# Patient Record
Sex: Male | Born: 1975
Health system: Southern US, Community
[De-identification: ages and names within clinical notes are randomized; demographics above are authoritative.]

## PROBLEM LIST (undated history)

## (undated) DIAGNOSIS — K219 Gastro-esophageal reflux disease without esophagitis: Secondary | ICD-10-CM

## (undated) DIAGNOSIS — Z973 Presence of spectacles and contact lenses: Secondary | ICD-10-CM

## (undated) DIAGNOSIS — G8929 Other chronic pain: Secondary | ICD-10-CM

## (undated) DIAGNOSIS — M549 Dorsalgia, unspecified: Secondary | ICD-10-CM

## (undated) DIAGNOSIS — Z720 Tobacco use: Secondary | ICD-10-CM

## (undated) DIAGNOSIS — F419 Anxiety disorder, unspecified: Secondary | ICD-10-CM

## (undated) DIAGNOSIS — G4489 Other headache syndrome: Secondary | ICD-10-CM

## (undated) HISTORY — DX: Dorsalgia, unspecified: M54.9

## (undated) HISTORY — PX: SHOULDER SURGERY: SHX246

## (undated) HISTORY — PX: MANDIBLE FRACTURE SURGERY: SHX706

## (undated) HISTORY — DX: Presence of spectacles and contact lenses: Z97.3

## (undated) HISTORY — DX: Other chronic pain: G89.29

## (undated) HISTORY — DX: Other headache syndrome: G44.89

## (undated) HISTORY — DX: Tobacco use: Z72.0

## (undated) HISTORY — DX: Anxiety disorder, unspecified: F41.9

---

## 2012-09-23 ENCOUNTER — Encounter (HOSPITAL_COMMUNITY): Payer: Self-pay | Admitting: *Deleted

## 2012-09-23 ENCOUNTER — Emergency Department (HOSPITAL_COMMUNITY)
Admission: EM | Admit: 2012-09-23 | Discharge: 2012-09-23 | Disposition: A | Discharge status: Home / Self Care | Payer: BC Managed Care – PPO | Attending: Emergency Medicine | Admitting: Emergency Medicine

## 2012-09-23 DIAGNOSIS — R5381 Other malaise: Secondary | ICD-10-CM | POA: Insufficient documentation

## 2012-09-23 DIAGNOSIS — R5383 Other fatigue: Secondary | ICD-10-CM | POA: Insufficient documentation

## 2012-09-23 DIAGNOSIS — R42 Dizziness and giddiness: Secondary | ICD-10-CM | POA: Insufficient documentation

## 2012-09-23 DIAGNOSIS — R110 Nausea: Secondary | ICD-10-CM | POA: Insufficient documentation

## 2012-09-23 DIAGNOSIS — F17200 Nicotine dependence, unspecified, uncomplicated: Secondary | ICD-10-CM | POA: Insufficient documentation

## 2012-09-23 DIAGNOSIS — R531 Weakness: Secondary | ICD-10-CM

## 2012-09-23 LAB — CBC WITH DIFFERENTIAL/PLATELET
Eosinophils Absolute: 0.1 10*3/uL (ref 0.0–0.7)
Eosinophils Relative: 1 % (ref 0–5)
Hemoglobin: 16.9 g/dL (ref 13.0–17.0)
Lymphs Abs: 1.9 10*3/uL (ref 0.7–4.0)
MCH: 33.1 pg (ref 26.0–34.0)
MCV: 93.7 fL (ref 78.0–100.0)
Monocytes Relative: 7 % (ref 3–12)
Platelets: 257 10*3/uL (ref 150–400)
RBC: 5.11 MIL/uL (ref 4.22–5.81)

## 2012-09-23 LAB — HEPATIC FUNCTION PANEL
ALT: 15 U/L (ref 0–53)
Alkaline Phosphatase: 87 U/L (ref 39–117)
Bilirubin, Direct: 0.2 mg/dL (ref 0.0–0.3)
Indirect Bilirubin: 1 mg/dL — ABNORMAL HIGH (ref 0.3–0.9)
Total Bilirubin: 1.2 mg/dL (ref 0.3–1.2)

## 2012-09-23 LAB — BASIC METABOLIC PANEL
CO2: 28 mEq/L (ref 19–32)
Calcium: 9.7 mg/dL (ref 8.4–10.5)
Glucose, Bld: 96 mg/dL (ref 70–99)
Sodium: 138 mEq/L (ref 135–145)

## 2012-09-23 NOTE — ED Notes (Signed)
Feels "light headed for 2-3 days".  Nausea, no vomiting , no diarrhea.  Had similar episode in November and attributed to "energy drinks".  Alert, talking,No Hx of HI, No tarry stools.

## 2012-09-23 NOTE — ED Provider Notes (Signed)
History    This chart was scribed for Benny Lennert, MD by Leone Payor, ED Scribe. This patient was seen in room APA02/APA02 and the patient's care was started 3:11 PM.   CSN: [id number]  Arrival date & time 09/23/12  1300   None     No chief complaint on file.    The history is provided by the patient. No language interpreter was used.    Vincent Walter is a 37 y.o. male who presents to the Emergency Department complaining of intermittent episodes of lightheadedness for 2-3 days ago. States when he has an episode he gets a hot flash and then is nauseated for about 1 hour afterwards. Reports having a similar episode in November that was attributed to an energy drinks. He denies fever, chills, vomiting, diarrhea. Pt is a current everyday smoker and occasional alcohol user.    History reviewed. No pertinent past medical history.  History reviewed. No pertinent past surgical history.  History reviewed. No pertinent family history.  History  Substance Use Topics  . Smoking status: Current Every Day Smoker  . Smokeless tobacco: Not on file  . Alcohol Use: Yes      Review of Systems  Constitutional: Negative for fever, chills, appetite change and fatigue.  HENT: Negative for congestion, sinus pressure and ear discharge.   Eyes: Negative for discharge.  Respiratory: Negative for cough.   Cardiovascular: Negative for chest pain.  Gastrointestinal: Positive for nausea. Negative for vomiting, abdominal pain and diarrhea.  Genitourinary: Negative for frequency and hematuria.  Musculoskeletal: Negative for back pain.  Skin: Negative for rash.  Neurological: Positive for light-headedness. Negative for seizures and headaches.  Psychiatric/Behavioral: Negative for hallucinations.    Allergies  Review of patient's allergies indicates no known allergies.  Home Medications  No current outpatient prescriptions on file.  BP 123/83  Pulse 70  Temp(Src) 97.8 F (36.6 C) (Oral)   Resp 20  Ht 5\' 8"  (1.727 m)  Wt 145 lb (65.772 kg)  BMI 22.05 kg/m2  SpO2 100%  Physical Exam  Nursing note and vitals reviewed. Constitutional: He is oriented to person, place, and time. He appears well-developed.  HENT:  Head: Normocephalic.  Eyes: Conjunctivae and EOM are normal. No scleral icterus.  Neck: Neck supple. No thyromegaly present.  Cardiovascular: Normal rate and regular rhythm.  Exam reveals no gallop and no friction rub.   No murmur heard. Pulmonary/Chest: No stridor. He has no wheezes. He has no rales. He exhibits no tenderness.  Abdominal: He exhibits no distension. There is no tenderness. There is no rebound.  Musculoskeletal: Normal range of motion. He exhibits no edema.  Lymphadenopathy:    He has no cervical adenopathy.  Neurological: He is oriented to person, place, and time. Coordination normal.  Skin: No rash noted. No erythema.  Psychiatric: He has a normal mood and affect. His behavior is normal.    ED Course  Procedures (including critical care time) DIAGNOSTIC STUDIES: Oxygen Saturation is 100% on room air, normal by my interpretation.    COORDINATION OF CARE: 3:12 PM Discussed treatment plan with pt at bedside and pt agreed to plan.   Results for orders placed during the hospital encounter of 09/23/12  BASIC METABOLIC PANEL      Result Value Range   Sodium 138  135 - 145 mEq/L   Potassium 4.1  3.5 - 5.1 mEq/L   Chloride 103  96 - 112 mEq/L   CO2 28  19 - 32 mEq/L  Glucose, Bld 96  70 - 99 mg/dL   BUN 9  6 - 23 mg/dL   Creatinine, Ser 4.09  0.50 - 1.35 mg/dL   Calcium 9.7  8.4 - 81.1 mg/dL   GFR calc non Af Amer >90  >90 mL/min   GFR calc Af Amer >90  >90 mL/min  CBC WITH DIFFERENTIAL      Result Value Range   WBC 6.8  4.0 - 10.5 K/uL   RBC 5.11  4.22 - 5.81 MIL/uL   Hemoglobin 16.9  13.0 - 17.0 g/dL   HCT 91.4  78.2 - 95.6 %   MCV 93.7  78.0 - 100.0 fL   MCH 33.1  26.0 - 34.0 pg   MCHC 35.3  30.0 - 36.0 g/dL   RDW 21.3  08.6 -  57.8 %   Platelets 257  150 - 400 K/uL   Neutrophils Relative 64  43 - 77 %   Neutro Abs 4.3  1.7 - 7.7 K/uL   Lymphocytes Relative 28  12 - 46 %   Lymphs Abs 1.9  0.7 - 4.0 K/uL   Monocytes Relative 7  3 - 12 %   Monocytes Absolute 0.5  0.1 - 1.0 K/uL   Eosinophils Relative 1  0 - 5 %   Eosinophils Absolute 0.1  0.0 - 0.7 K/uL   Basophils Relative 1  0 - 1 %   Basophils Absolute 0.0  0.0 - 0.1 K/uL  TROPONIN I      Result Value Range   Troponin I <0.30  <0.30 ng/mL  HEPATIC FUNCTION PANEL      Result Value Range   Total Protein 7.2  6.0 - 8.3 g/dL   Albumin 4.2  3.5 - 5.2 g/dL   AST 16  0 - 37 U/L   ALT 15  0 - 53 U/L   Alkaline Phosphatase 87  39 - 117 U/L   Total Bilirubin 1.2  0.3 - 1.2 mg/dL   Bilirubin, Direct 0.2  0.0 - 0.3 mg/dL   Indirect Bilirubin 1.0 (*) 0.3 - 0.9 mg/dL    No results found.   No diagnosis found.    MDM      The chart was scribed for me under my direct supervision.  I personally performed the history, physical, and medical decision making and all procedures in the evaluation of this patient.Benny Lennert, MD 09/23/12 [place] [place]

## 2012-09-23 NOTE — ED Notes (Signed)
Patient notes lightheadedness, slight vertigo w/position changes from sitting to standing on occasion. This is usually accompanied by "an out of body kind of feeling" and nausea.  Recent past had what he thought might be sinus infection.  Tympanic membranes transluscent w/+ light reflex.  No c/o HAs, changes in appetite or pain.

## 2012-09-25 ENCOUNTER — Encounter: Payer: Self-pay | Admitting: Medical

## 2012-09-25 ENCOUNTER — Ambulatory Visit (INDEPENDENT_AMBULATORY_CARE_PROVIDER_SITE_OTHER): Payer: BC Managed Care – PPO | Admitting: Medical

## 2012-09-25 VITALS — BP 112/70 | HR 70 | Temp 98.3°F | Resp 16 | Ht 68.0 in | Wt 138.0 lb

## 2012-09-25 DIAGNOSIS — R42 Dizziness and giddiness: Secondary | ICD-10-CM

## 2012-09-25 DIAGNOSIS — R630 Anorexia: Secondary | ICD-10-CM

## 2012-09-25 DIAGNOSIS — R5381 Other malaise: Secondary | ICD-10-CM

## 2012-09-25 DIAGNOSIS — F430 Acute stress reaction: Secondary | ICD-10-CM

## 2012-09-25 DIAGNOSIS — R002 Palpitations: Secondary | ICD-10-CM

## 2012-09-25 LAB — POCT URINALYSIS DIPSTICK
Ketones, UA: NEGATIVE
Protein, UA: NEGATIVE
Spec Grav, UA: <=1.005
pH, UA: 5

## 2012-09-25 MED ORDER — ALPRAZOLAM 0.25 MG PO TABS: 0.2500 mg | ORAL_TABLET | Freq: Two times a day (BID) | ORAL | Status: DC | PRN

## 2012-09-25 NOTE — Progress Notes (Signed)
Subjective:  Vincent Walter is a 37 y.o. male who presents as a new patient.  Was seen recently in the ED for lightheadedness.  Here with fiance.  Lives at home with fiance and 7yo daughter, works in Paramedic, Teacher, English as a foreign language, does work for [employer].  Smokes 1ppd.  Has 2 beers during the week but 12 pack on weekends.   He reports that his 17yo daughter has hx/o panic attacks.    He was seen in the ED recently for lightheadedness.  They felt like this could be anxiety. Had labs.  Recently he walked into a store, got hot flushed feeling, lightheaded, felt like he was going to pass out.  When he got to the register, a guy started talking to him, and he was feeling better.  But then when he got back in the care ,felt the same way again.  The last 3 days has felt lightheadded, exhausted .  Fiance took him to ED this past week for this symptoms.  This week he has felt tired.  He notes hx/o similar event 11/13, was taken to ED in [place] for the same.  He is stressed.  Their baby sitter left, had to transition one daughter into a new school, husband anxious about getting married, getting fiance a ring, sometimes worries about dying.     He sometimes skips meals.  Tends to drink energy drinks 2-3 times per week at least.  Drinks at least 3 sodas daily.  No other aggravating or relieving factors.    No other c/o.  The following portions of the patient's history were reviewed and updated as appropriate: allergies, current medications, past family history, past medical history, past social history, past surgical history and problem list.  Review of Systems Constitutional: -fever, -chills, -sweats, -unexpected weight change, +fatigue, +decreased appetite ENT: -runny nose, -ear pain, -sore throat Cardiology:  -chest pain, +palpitations, -edema Respiratory: -cough, -shortness of breath, -wheezing Gastroenterology: -abdominal pain, +nausea, -vomiting, -diarrhea, -constipation  Hematology: -bleeding or  bruising problems Musculoskeletal: -arthralgias, -myalgias, -joint swelling, -back pain Ophthalmology: -vision changes Urology: -dysuria, -difficulty urinating, -hematuria, -urinary frequency, -urgency Neurology: -headache, -weakness, -tingling, -numbness   Past Medical History  Diagnosis Date  . Wears glasses     Objective: Physical Exam  Vital signs reviewed  General appearance: alert, no distress, WD/WN, lean white male HEENT: normocephalic, sclerae anicteric, conjunctiva pink and moist, TMs pearly, nares patent, no discharge or erythema, pharynx normal Oral cavity: MMM, no lesions Neck: supple, no lymphadenopathy, no thyromegaly, no masses Heart: RRR, normal S1, S2, no murmurs Lungs: CTA bilaterally, no wheezes, rhonchi, or rales Abdomen: +bs, soft, non tender, non distended, no masses, no hepatomegaly, no splenomegaly Pulses: 2+ radial pulses, 2+ pedal pulses, normal cap refill Ext: no edema Neuro: CN2-12 intact, nonfocal exam Psych: pleasant, good eye contact, answers questions appropriately   Adult ECG Report  Indication: palpitations  Rate: 55 bpm  Rhythm: sinus bradycardia  QRS Axis: 78 degrees  PR Interval:  QRS Duration: 96ms  QTc:  Conduction Disturbances: none  Other Abnormalities: none  Patient's cardiac risk factors are: male gender.  EKG comparison: none  Narrative Interpretation: sinus bradycardia   Assessment: Encounter Diagnoses  Name Primary?  . Lightheadedness Yes  . Palpitations   . Other malaise and fatigue   . Decreased appetite     Plan: Reviewed EKG, symptoms, concerns.  Reviewed recent ED report, labs from ED.   Will check thyroid labs today.  Discussed his recent symptoms, short term script  for Xanax for possible panic attack, discussed risks/benefits of medication, discussed stress reduction, significantly decreasing caffeine and alcohol intake, increasing water intake, eating healthy.  Advised if he continues to have  palpitation, pre-syncope or similar despite the changes discussed, may need to have Holter testing, but wait and see approach.  Follow up:  2-3 wk.

## 2012-09-25 NOTE — Patient Instructions (Signed)
Work on stress reduction  Try and cut back gradually on caffeine.   STOP energy drinks  Drink more water in general  Cut back on the weekend alcohol   Avoid going more than 4 hours without some food source.     Anxiety and Panic Attacks Your caregiver has informed you that you are having an anxiety or panic attack. There may be many forms of this. Most of the time these attacks come suddenly and without warning. They come at any time of day, including periods of sleep, and at any time of life. They may be strong and unexplained. Although panic attacks are very scary, they are physically harmless. Sometimes the cause of your anxiety is not known. Anxiety is a protective mechanism of the body in its fight or flight mechanism. Most of these perceived danger situations are actually nonphysical situations (such as anxiety over losing a job). CAUSES  The causes of an anxiety or panic attack are many. Panic attacks may occur in otherwise healthy people given a certain set of circumstances. There may be a genetic cause for panic attacks. Some medications may also have anxiety as a side effect. SYMPTOMS  Some of the most common feelings are:  Intense terror.   Dizziness, feeling faint.   Hot and cold flashes.   Fear of going crazy.   Feelings that nothing is real.   Sweating.   Shaking.   Chest pain or a fast heartbeat (palpitations).   Smothering, choking sensations.   Feelings of impending doom and that death is near.   Tingling of extremities, this may be from over-breathing.   Altered reality (derealization).   Being detached from yourself (depersonalization).  Several symptoms can be present to make up anxiety or panic attacks. DIAGNOSIS  The evaluation by your caregiver will depend on the type of symptoms you are experiencing. The diagnosis of anxiety or panic attack is made when no physical illness can be determined to be a cause of the symptoms. TREATMENT  Treatment to  prevent anxiety and panic attacks may include:  Avoidance of circumstances that cause anxiety.   Reassurance and relaxation.   Regular exercise.   Relaxation therapies, such as yoga.   Psychotherapy with a psychiatrist or therapist.   Avoidance of caffeine, alcohol and illegal drugs.   Prescribed medication.  SEEK IMMEDIATE MEDICAL CARE IF:   You experience panic attack symptoms that are different than your usual symptoms.   You have any worsening or concerning symptoms.  Document Released: 05/08/2005 Document Revised: 01/18/2011 Document Reviewed: 09/09/2009 [facility] Patient Information 2012 [place], [place].

## 2012-09-26 ENCOUNTER — Telehealth: Payer: Self-pay | Admitting: Internal Medicine

## 2012-09-26 NOTE — Telephone Encounter (Signed)
Pt called wanting to know if you wanted him to go on light duty and no lifting heavy things due to his light headiness and his low heart rate.

## 2012-09-27 NOTE — Telephone Encounter (Signed)
What kind of work does he do - get some specifics

## 2012-09-27 NOTE — Telephone Encounter (Signed)
Patient states to not worry working light duty because they don't have that they will just send him home. CLS

## 2012-10-22 ENCOUNTER — Encounter: Payer: Self-pay | Admitting: Medical

## 2012-10-22 ENCOUNTER — Ambulatory Visit (INDEPENDENT_AMBULATORY_CARE_PROVIDER_SITE_OTHER): Payer: BC Managed Care – PPO | Admitting: Medical

## 2012-10-22 VITALS — BP 120/80 | HR 76 | Temp 98.1°F | Resp 18 | Wt 139.0 lb

## 2012-10-22 DIAGNOSIS — F411 Generalized anxiety disorder: Secondary | ICD-10-CM

## 2012-10-22 MED ORDER — ALPRAZOLAM 0.25 MG PO TABS: 0.2500 mg | ORAL_TABLET | Freq: Two times a day (BID) | ORAL | Status: DC | PRN

## 2012-10-22 NOTE — Patient Instructions (Signed)
RESOURCES in [place], [place]  If you are experiencing a mental health crisis or an emergency, please call 911 or go to the nearest emergency department.  [facility]   [phone] [facility]  [phone] [facility]   [phone]  Suicide Hotline 1-800-Suicide (1-800-784-2433)  National Suicide Prevention Lifeline 1-800-273-TALK  (1-800-273-8255)  Domestic Violence, Rape/Crisis - [facility] 336-273-7273  The National Domestic Violence Hotline 1-800-799-SAFE (1-800-799-7233)  To report Child or Elder Abuse, please call: [place] Police Department  [phone] [place] Sherriff Department  [phone]  LGBT Youth Crisis Line 1-866-488-7386  Teen Crisis line 336-387-6161 or 1-877-332-7333     Psychiatry and Counseling services   Dr. Parish McKinney, psychiatry [phone] office [email or web address] [address], [address], [place], [place] [address] Dr. Parish McKinney Meredith Baker, NP Micheal Lefaive, NP  Anxiety, Depression, ADHD, OCD, Eating Disorders, Bipolar, other   [facility] [phone] office [email or web address] [address] [place], [place] [address]  Dr. Masoud Hejazi, psychiatry services  Dr. Todd Lewis  Depression, Anxiety, Substance Abuse, Couples Issues, Adolescent Issues Robyn Bridges, NP Depression, Anxiety, ADHD, Women's issues, Bipolar Disorder, Substance   Abuse Kelly Virgil, NP Depression, Anxiety, Aging, ADHD, Bipolar Disorder, Substance Abuse Ann Bailey, NP  Mood disturbances, ADHD, children, adolescents, adults Deb Connery, Therapist Sexual Addiction, Bipolar, Depression, Anxiety, Substance Addiction Claudia McCoy, Therapist Grief and Loss, Anxiety, Depression, Bipolar, Medical Challenges, Life    Transitions Linda Hileman, Therapist Substance Abuse, Relationships, Clergy Families, Anger and Stress Management, Postpartum Depression,  Pre-Marital Counseling Patricia Goral, Therapist [place], Anxiety, Depression, ADHD, Adjustment Disorder, PTSD, Grief and Loss, Divorce, Adoption Concerns   [facility] [phone] office [email or web address] [address], [place], [place] [address]  Erik Nelson, MA, clinical psychologist  Cognitive-Behavior Therapy; Mood Disorders; Anxiety Disorders; adult and child ADHD; Family Therapy; Stress Management; personal growth, and Marital Therapy.    Dennis McKnight Ph.D., clinical psychologist Cognitive-Behavior Therapy; Mood Disorders; Anxiety Disorders; Stress     Management   Elaine Talbert Ph.D., clinical psychologist [phone] office [address] [place], [place] [address] Cognitive Behavior Therapy, Depression, Bipolar, Anxiety, Grief and Loss    [facility] [phone] office [address] [place], [place] [address] Crisis services, Family support, in home therapy, treatment for Anxiety, PTSD, Sexual Assault, Substance Abuse, Financial/Credit Counseling, Variety of other services    [email or web address] [phone] office [address], [place], [place] [address]  Scott Yount, Ph.D., LPC Family, Couples, Anxiety, Depression, ADHD, Abuse, Anger Management Catharine Dowda, M.Ed., LPC Couples, Sexual orientation, Domestic violence, Child Abuse, Major Life Change,  Depression Traci Collins, LPC Marriage counseling, Women's Issues, Depression, Intimacy, Career Issues Katherine Glenn, Ph.D.,  LPC PTSD, Addictions, Grief, Anxiety, Sexual Orientation Eugene Naughton, LPC Teen and child depression, anxiety, parenting challenges, Adult depression, self injury, relationship issues. Karen Elliott, LPC Addiction, PTSD, Eating Disorders, Depression, Sexual Orientation Sara DeHart Young, LPC Eating  disorder, Anxiety and Depression, Grief, Divorce, Couples and Family Counseling, Parenting   Dr. Gerald Plovsky, psychiatry [phone] office [address] [place], [place] [address]  Geriatric psychiatry services   [facility] [phone] office, 24x7 help line [email or web address] [address] [place], [place] [address] Substance Abuse, Depression, Anxiety, Mood Disorders, other Addictions, DWI Assessment/Treatment, Teen Issues, ADHD, Family Therapy Dr. Carol Sena, Psychiatry services   Stephen Ringer, Therapist Initial assessments, Clinical Director, Substance Abuse counseling, DWI and DMV assessments, individual and group counseling   Nancy King, Therapist Depression, Anxiety, Dysfunctional families, Individual and Couples Counseling, Addiction, Sexual Abuse, Childhood Trauma, Spiritually Based Counseling Robin Ringer, Therapist [facility], Children and Adult Individual Counseling, Depression, Anxiety, Mood Disorders Valerie Phillips, Therapist Ages 5 and up, individual, couple and family therapy, family concerns, ADHD, Mood disorders, Grief, Substance Abuse Melissa Mekita, Therapist Male patients only - Mood disorders, Depression, Anxiety, PTSD, Gried,   Abuse, Relationships   Dr. Rupinder Kaur, psychiatry [phone] office [address], [place], [place] [address]   

## 2012-10-22 NOTE — Progress Notes (Signed)
Subjective: Here for recheck.   Girlfriend accompanies him.  Last visit he was a new patient with several concerns - palpitations, fatigue, panicky feeling.   We discussed last visit the likelihood that his symptoms represent anxiety.  Since last visit he has cut out beer mostly, still smoking though, has significantly cut back on caffeine.  Busy at work.   Has used the xanax on several occasions, and it seems to help.  The medication helps calm him down.  At times feels overwhelmed, seems like he has a lot on his plate at work and at home.  He thinks most of his issues are due to work stress, relationship with his boss.   Some days is fine, others is like boss has an issue with him.   He also has stress with home issues - they recently moved, they are contemplating buying another car due to family needs, still trying to finance a ring for fiance/girlfriend of 9 years.  Overall he does feel like he recognizes now that the symptoms have been related to his stress level and anxiety  Past Medical History  Diagnosis Date  . Wears glasses    ROS as in subjective  Objective: Gen: wd, wn, nad Psych: seems more relaxed today, good eye contact, answers questions appropriately  Assessment: Encounter Diagnosis  Name Primary?  Marland Kitchen Anxiety state, unspecified Yes     Plan: Since last visit, no additional palpitations, no lightheadedness.  He is more aware of acute panic symptoms, when he is stressed, and when he is feeling more comfortable.  He has cut back on alcohol and caffeine.   He is using the Xanax appropriately.   I advised he seek both general and financial counseling, establish a budget for his household, work on stress reduction, work on [employer], and cutting out any excess spending that may be causing undue stress.   He has employee assistance program through work that offers counseling.  Advised he pursue this.  F/u 1-2 mo

## 2013-02-06 ENCOUNTER — Encounter (HOSPITAL_COMMUNITY): Payer: Self-pay | Admitting: Emergency Medicine

## 2013-02-06 ENCOUNTER — Emergency Department (HOSPITAL_COMMUNITY)
Admission: EM | Admit: 2013-02-06 | Discharge: 2013-02-06 | Disposition: A | Discharge status: Home / Self Care | Payer: BC Managed Care – PPO | Attending: Emergency Medicine | Admitting: Emergency Medicine

## 2013-02-06 DIAGNOSIS — F17200 Nicotine dependence, unspecified, uncomplicated: Secondary | ICD-10-CM | POA: Insufficient documentation

## 2013-02-06 DIAGNOSIS — Y939 Activity, unspecified: Secondary | ICD-10-CM | POA: Insufficient documentation

## 2013-02-06 DIAGNOSIS — IMO0002 Reserved for concepts with insufficient information to code with codable children: Secondary | ICD-10-CM | POA: Insufficient documentation

## 2013-02-06 DIAGNOSIS — Y929 Unspecified place or not applicable: Secondary | ICD-10-CM | POA: Insufficient documentation

## 2013-02-06 DIAGNOSIS — T162XXA Foreign body in left ear, initial encounter: Secondary | ICD-10-CM

## 2013-02-06 DIAGNOSIS — T169XXA Foreign body in ear, unspecified ear, initial encounter: Secondary | ICD-10-CM | POA: Insufficient documentation

## 2013-02-06 MED ORDER — ANTIPYRINE-BENZOCAINE 5.4-1.4 % OT SOLN
OTIC | Status: AC
  Filled 2013-02-06: qty 10

## 2013-02-06 NOTE — ED Provider Notes (Signed)
CSN: [id number]     Arrival date & time 02/06/13  1835 History   First MD Initiated Contact with Patient 02/06/13 1850     Chief Complaint  Patient presents with  . Foreign Body in Ear   (Consider location/radiation/quality/duration/timing/severity/associated sxs/prior Treatment) HPI Comments: Jenner Rosier is a 37 y.o. Male who was walking out of his childs school just prior to arrival when a bug flew in his left ear.  He describes a fluttering sensation but denies pain in the ear.  He is otherwise without complaint.  He denies decreased hearing acuity.     The history is provided by the patient.    Past Medical History  Diagnosis Date  . Wears glasses    History reviewed. No pertinent past surgical history. Family History  Problem Relation Age of Onset  . Alcohol abuse Mother   . Hypertension Father   . Hyperlipidemia Brother   . Anxiety disorder Daughter   . Heart disease Neg Hx   . Other Neg Hx    History  Substance Use Topics  . Smoking status: Current Every Day Smoker -- 1.00 packs/day    Types: Cigarettes  . Smokeless tobacco: Never Used  . Alcohol Use: 4.2 oz/week    7 Cans of beer per week    Review of Systems  HENT: Negative for hearing loss, ear pain and ear discharge.   Neurological: Negative for dizziness and headaches.  All other systems reviewed and are negative.    Allergies  Review of patient's allergies indicates no known allergies.  Home Medications   Current Outpatient Rx  Name  Route  Sig  Dispense  Refill  . ALPRAZolam (XANAX) 0.25 MG tablet   Oral   Take 1 tablet (0.25 mg total) by mouth 2 (two) times daily as needed for sleep.   30 tablet   1    BP 148/71  Pulse 65  Temp(Src) 98.4 F (36.9 C)  Resp 16  Ht 5\' 8"  (1.727 m)  Wt 145 lb (65.772 kg)  BMI 22.05 kg/m2  SpO2 100% Physical Exam  Constitutional: He is oriented to person, place, and time. He appears well-developed and well-nourished.  HENT:  Head: Normocephalic and  atraumatic.  Left Ear: Tympanic membrane normal. A foreign body is present.  Nose: Nose normal.  Mouth/Throat: Uvula is midline, oropharynx is clear and moist and mucous membranes are normal. No oropharyngeal exudate, posterior oropharyngeal edema, posterior oropharyngeal erythema or tonsillar abscesses.  Small worm like object along anterior canal.  Eyes: Conjunctivae are normal.  Cardiovascular: Normal rate and normal heart sounds.   Pulmonary/Chest: Effort normal. No respiratory distress. He has no wheezes. He has no rales.  Abdominal: Soft. There is no tenderness.  Musculoskeletal: Normal range of motion.  Neurological: He is alert and oriented to person, place, and time.  Skin: Skin is warm and dry. No rash noted.  Psychiatric: He has a normal mood and affect.    ED Course  FOREIGN BODY REMOVAL Date/Time: 02/06/2013 7:16 PM Performed by: Burgess Amor Authorized by: Burgess Amor Consent: Verbal consent obtained. Risks and benefits: risks, benefits and alternatives were discussed Consent given by: patient Body area: ear Location details: left ear Localization method: visualized Removal mechanism: forceps Complexity: simple 1 objects recovered. Objects recovered: worm Post-procedure assessment: foreign body removed Patient tolerance: Patient tolerated the procedure well with no immediate complications.   (including critical care time) Labs Review Labs Reviewed - No data to display Imaging Review No results found.  MDM  1. Ear foreign body, left, initial encounter    Prn f/u anticipated    Burgess Amor, Cordelia Poche 02/06/13 1610

## 2013-02-06 NOTE — ED Provider Notes (Signed)
Medical screening examination/treatment/procedure(s) were performed by non-physician practitioner and as supervising physician I was immediately available for consultation/collaboration.   Glynn Octave, MD 02/06/13 2052

## 2014-01-04 ENCOUNTER — Emergency Department (HOSPITAL_COMMUNITY)
Admission: EM | Admit: 2014-01-04 | Discharge: 2014-01-04 | Disposition: A | Discharge status: Home / Self Care | Payer: BC Managed Care – PPO | Attending: Emergency Medicine | Admitting: Emergency Medicine

## 2014-01-04 ENCOUNTER — Emergency Department (HOSPITAL_COMMUNITY): Payer: BC Managed Care – PPO

## 2014-01-04 ENCOUNTER — Encounter (HOSPITAL_COMMUNITY): Payer: Self-pay | Admitting: Emergency Medicine

## 2014-01-04 DIAGNOSIS — S6391XA Sprain of unspecified part of right wrist and hand, initial encounter: Secondary | ICD-10-CM

## 2014-01-04 DIAGNOSIS — Y9289 Other specified places as the place of occurrence of the external cause: Secondary | ICD-10-CM | POA: Insufficient documentation

## 2014-01-04 DIAGNOSIS — F17200 Nicotine dependence, unspecified, uncomplicated: Secondary | ICD-10-CM | POA: Diagnosis not present

## 2014-01-04 DIAGNOSIS — X500XXA Overexertion from strenuous movement or load, initial encounter: Secondary | ICD-10-CM | POA: Diagnosis not present

## 2014-01-04 DIAGNOSIS — Y9389 Activity, other specified: Secondary | ICD-10-CM | POA: Diagnosis not present

## 2014-01-04 DIAGNOSIS — S6990XA Unspecified injury of unspecified wrist, hand and finger(s), initial encounter: Secondary | ICD-10-CM | POA: Diagnosis present

## 2014-01-04 DIAGNOSIS — S6390XA Sprain of unspecified part of unspecified wrist and hand, initial encounter: Secondary | ICD-10-CM | POA: Insufficient documentation

## 2014-01-04 MED ORDER — ACETAMINOPHEN 500 MG PO TABS
1000.0000 mg | ORAL_TABLET | Freq: Once | ORAL | Status: AC
  Administered 2014-01-04: 1000 mg via ORAL
  Filled 2014-01-04: qty 2

## 2014-01-04 MED ORDER — IBUPROFEN 800 MG PO TABS
800.0000 mg | ORAL_TABLET | Freq: Once | ORAL | Status: AC
  Administered 2014-01-04: 800 mg via ORAL
  Filled 2014-01-04: qty 1

## 2014-01-04 NOTE — ED Provider Notes (Signed)
CSN: [id number]     Arrival date & time 01/04/14  1506 History  This chart was scribed for non-physician practitioner, Ivery QualeHobson Charlsey Moragne, PA-C,working with Samuel JesterKathleen McManus, DO, by Karle PlumberJennifer Tensley, ED Scribe. This patient was seen in room APFT23/APFT23 and the patient's care was started at 4:00 PM.  Chief Complaint  Patient presents with  . Hand Pain   Patient is a 38 y.o. male presenting with hand pain. The history is provided by the patient. No language interpreter was used.  Hand Pain   HPI Comments:  Vincent Walter is a 38 y.o. male who presents to the Emergency Department complaining of moderate right hand pain secondary to rolling off the bed and landing on his hyperextended fingers. He reports the most pain is in his index finger. He denies numbness, tingling or weakness of the hand or fingers. He denies any previous surgeries or injuries to the hand  Past Medical History  Diagnosis Date  . Wears glasses    History reviewed. No pertinent past surgical history. Family History  Problem Relation Age of Onset  . Alcohol abuse Mother   . Hypertension Father   . Hyperlipidemia Brother   . Anxiety disorder Daughter   . Heart disease Neg Hx   . Other Neg Hx    History  Substance Use Topics  . Smoking status: Current Every Day Smoker -- 1.00 packs/day    Types: Cigarettes  . Smokeless tobacco: Never Used  . Alcohol Use: 4.2 oz/week    7 Cans of beer per week     Comment: occasional    Review of Systems  Musculoskeletal: Positive for arthralgias.  All other systems reviewed and are negative.   Allergies  Review of patient's allergies indicates no known allergies.  Home Medications   Prior to Admission medications   Medication Sig Start Date End Date Taking? Authorizing Provider  ALPRAZolam (XANAX) 0.25 MG tablet Take 1 tablet (0.25 mg total) by mouth 2 (two) times daily as needed for sleep. 10/22/12   Jac Canavanavid S Tysinger, PA-C   Triage Vitals: BP 139/70  Pulse 91  Temp(Src)  99.2 F (37.3 C) (Oral)  Resp 18  Ht 5\' 8"  (1.727 m)  Wt 145 lb (65.772 kg)  BMI 22.05 kg/m2  SpO2 99% Physical Exam  Nursing note and vitals reviewed. Constitutional: He is oriented to person, place, and time. He appears well-developed and well-nourished.  HENT:  Head: Normocephalic and atraumatic.  Eyes: EOM are normal.  Neck: Normal range of motion.  Cardiovascular: Normal rate.   Cap refill less than 2 seconds. Radial pulses 2+.  Pulmonary/Chest: Effort normal.  Musculoskeletal: Normal range of motion. He exhibits tenderness.       Hands: Full ROM of right shoulder and elbow. No hematoma or deformity of the forearm. No deformity of the wrist. No pain in the anatomical snuff box. Metacarpal heads in alignment. Swelling of webspace between thumb and index finger. Mild bruising present. Good ROM of fingers. Pain with ROM of right thumb.  Neurological: He is alert and oriented to person, place, and time.  Skin: Skin is warm and dry.  Psychiatric: He has a normal mood and affect. His behavior is normal.    ED Course  Procedures (including critical care time) DIAGNOSTIC STUDIES: Oxygen Saturation is 99% on RA, normal by my interpretation.   COORDINATION OF CARE: 4:05 PM- Will wrap hand. Advised pt to RICE the hand. Advised pt to take OTC Ibuprofen for pain and swelling. Pt verbalizes understanding and agrees  to plan.  Medications - No data to display  Labs Review Labs Reviewed - No data to display  Imaging Review Dg Hand Complete Right  01/04/2014   CLINICAL DATA:  RIGHT hand swelling across metacarpals, fell off bed  EXAM: RIGHT HAND - COMPLETE 3+ VIEW  COMPARISON:  None  FINDINGS: Osseous mineralization normal.  Joint spaces preserved.  No acute fracture, dislocation, or bone destruction.  Mild dorsal soft tissue swelling overlying the distal metacarpals on lateral view.  IMPRESSION: No acute osseous abnormalities.   Electronically Signed   By: Ulyses Southward M.D.   On:  01/04/2014 15:49     EKG Interpretation None      MDM X-ray of the right hand is negative for acute fracture, dislocation, or bony destruction. There is no pain in the anatomical snuff box. Suspect the patient has a strain sprain involving the right hand. The patient is fitted with a Watson-Jones splint. Ice pack. He will use ibuprofen and Tylenol for soreness. He is to followup with orthopedics if not improving.    Final diagnoses:  None    **I have reviewed nursing notes, vital signs, and all appropriate lab and imaging results for this patient.*  I personally performed the services described in this documentation, which was scribed in my presence. The recorded information has been reviewed and is accurate.    Kathie Dike, PA-C 01/04/14 1724

## 2014-01-04 NOTE — ED Notes (Signed)
Patient reports was playing with daughter yesterday when he fell on top of right hand and bent fingers back. Complaining of pain to right hand.

## 2014-01-04 NOTE — Discharge Instructions (Signed)
Your x-ray is negative for fracture or dislocation. You have a hand sprain. Please use the Watson-Jones dressing for the next 3 days. Please elevate your hand is much as possible. Please apply ice packs. May use Tylenol and ibuprofen for soreness if needed. Please elevate your hand above your heart is much as possible. Please see the orthopedist listed above if not improving.

## 2014-01-05 NOTE — ED Provider Notes (Signed)
Medical screening examination/treatment/procedure(s) were performed by non-physician practitioner and as supervising physician I was immediately available for consultation/collaboration.   EKG Interpretation None        [place], DO 01/05/14 1550 

## 2014-02-20 ENCOUNTER — Encounter (HOSPITAL_COMMUNITY): Payer: Self-pay | Admitting: Emergency Medicine

## 2014-02-20 ENCOUNTER — Emergency Department (HOSPITAL_COMMUNITY)
Admission: EM | Admit: 2014-02-20 | Discharge: 2014-02-20 | Disposition: A | Discharge status: Home / Self Care | Payer: BC Managed Care – PPO | Attending: Emergency Medicine | Admitting: Emergency Medicine

## 2014-02-20 DIAGNOSIS — Z720 Tobacco use: Secondary | ICD-10-CM | POA: Insufficient documentation

## 2014-02-20 DIAGNOSIS — K029 Dental caries, unspecified: Secondary | ICD-10-CM | POA: Insufficient documentation

## 2014-02-20 DIAGNOSIS — K088 Other specified disorders of teeth and supporting structures: Secondary | ICD-10-CM | POA: Insufficient documentation

## 2014-02-20 DIAGNOSIS — K0381 Cracked tooth: Secondary | ICD-10-CM | POA: Insufficient documentation

## 2014-02-20 DIAGNOSIS — S025XXA Fracture of tooth (traumatic), initial encounter for closed fracture: Secondary | ICD-10-CM

## 2014-02-20 MED ORDER — AMOXICILLIN 500 MG PO CAPS: 500.0000 mg | ORAL_CAPSULE | Freq: Three times a day (TID) | ORAL | Status: DC

## 2014-02-20 MED ORDER — HYDROCODONE-ACETAMINOPHEN 5-325 MG PO TABS: 1.0000 | ORAL_TABLET | ORAL | Status: DC | PRN

## 2014-02-20 MED ORDER — OXYCODONE-ACETAMINOPHEN 5-325 MG PO TABS
1.0000 | ORAL_TABLET | Freq: Once | ORAL | Status: AC
  Administered 2014-02-20: 1 via ORAL

## 2014-02-20 MED ORDER — OXYCODONE-ACETAMINOPHEN 5-325 MG PO TABS
ORAL_TABLET | ORAL | Status: AC
  Filled 2014-02-20: qty 1

## 2014-02-20 MED ORDER — AMOXICILLIN 250 MG PO CAPS
500.0000 mg | ORAL_CAPSULE | Freq: Once | ORAL | Status: AC
  Administered 2014-02-20: 500 mg via ORAL
  Filled 2014-02-20: qty 2

## 2014-02-20 NOTE — ED Provider Notes (Signed)
CSN: [id number]     Arrival date & time 02/20/14  1743 History   First MD Initiated Contact with Patient 02/20/14 1814     Chief Complaint  Patient presents with  . Dental Pain     (Consider location/radiation/quality/duration/timing/severity/associated sxs/prior Treatment) Patient is a 38 y.o. male presenting with tooth pain. The history is provided by the patient.  Dental Pain Location:  Lower Lower teeth location:  17/LL 3rd molar Quality:  Throbbing and constant Severity:  Severe Onset quality:  Gradual Duration:  6 hours Timing:  Constant Progression:  Worsening Chronicity:  New Relieved by:  Nothing Worsened by:  Cold food/drink and pressure Ineffective treatments: BC powder. Associated symptoms: facial pain   Risk factors: smoking    Vincent Walter is a 38 y.o. male who presents to the ED with dental pain that started today. He states that the filling fell out of the tooth and it broke. He took OTC medication without relief. The pain goes from the left jaw into the left ear.   Past Medical History  Diagnosis Date  . Wears glasses    Past Surgical History  Procedure Laterality Date  . Mandible fracture surgery     Family History  Problem Relation Age of Onset  . Alcohol abuse Mother   . Hypertension Father   . Hyperlipidemia Brother   . Anxiety disorder Daughter   . Heart disease Neg Hx   . Other Neg Hx    History  Substance Use Topics  . Smoking status: Current Every Day Smoker -- 1.00 packs/day    Types: Cigarettes  . Smokeless tobacco: Never Used  . Alcohol Use: 4.2 oz/week    7 Cans of beer per week     Comment: occasional    Review of Systems Negative except as stated in HPI   Allergies  Review of patient's allergies indicates no known allergies.  Home Medications   Prior to Admission medications   Not on File   BP 146/87  Pulse 73  Temp(Src) 98.3 F (36.8 C) (Oral)  Resp 18  Ht 5\' 8"  (1.727 m)  Wt 145 lb (65.772 kg)  BMI 22.05  kg/m2  SpO2 100% Physical Exam  Nursing note and vitals reviewed. Constitutional: He is oriented to person, place, and time. He appears well-developed and well-nourished.  HENT:  Head: Normocephalic.  Right Ear: Tympanic membrane normal.  Left Ear: Tympanic membrane normal.  Nose: Nose normal.  Mouth/Throat: Uvula is midline, oropharynx is clear and moist and mucous membranes are normal.    Left lower third molar with decay and is broken. Tender on exam. Gum surrounding the tooth is swollen with erythema.   Eyes: EOM are normal.  Neck: Neck supple.  Cardiovascular: Normal rate and regular rhythm.   Pulmonary/Chest: Effort normal and breath sounds normal.  Musculoskeletal: Normal range of motion.  Lymphadenopathy:    He has cervical adenopathy (left).  Neurological: He is alert and oriented to person, place, and time. No cranial nerve deficit.  Skin: Skin is warm and dry.  Psychiatric: He has a normal mood and affect. His behavior is normal.    ED Course  Procedures   MDM  38 y.o. male with decayed broken tooth left lower third molar. He does have dental insurance and will follow up with his dentist as soon as possible. Will treat for pain and infection. Discussed with the patient clinical findings and plan of care and all questioned fully answered. He will return if any problems arise.  Medication List         amoxicillin 500 MG capsule  Commonly known as:  AMOXIL  Take 1 capsule (500 mg total) by mouth 3 (three) times daily.     HYDROcodone-acetaminophen 5-325 MG per tablet  Commonly known as:  NORCO/VICODIN  Take 1 tablet by mouth every 4 (four) hours as needed.            [address]Zarra Geffert Winter Beach[place] Jamareon Shimel, [place]NP 02/21/14 [phone]

## 2014-02-20 NOTE — ED Notes (Signed)
Pain lt mandibular molar and into ear

## 2014-02-20 NOTE — ED Notes (Signed)
Patient with no complaints at this time. Respirations even and unlabored. Skin warm/dry. Discharge instructions reviewed with patient at this time. Patient given opportunity to voice concerns/ask questions. Patient discharged at this time and left Emergency Department with steady gait.   

## 2014-02-23 NOTE — ED Provider Notes (Signed)
Medical screening examination/treatment/procedure(s) were performed by non-physician practitioner and as supervising physician I was immediately available for consultation/collaboration.   EKG Interpretation None      Doratha Mcswain, MD, FACEP   Shakeema Lippman L Caspian Deleonardis, MD 02/23/14 2227 

## 2015-08-11 ENCOUNTER — Encounter: Payer: Self-pay | Admitting: Medical

## 2015-08-11 ENCOUNTER — Ambulatory Visit (INDEPENDENT_AMBULATORY_CARE_PROVIDER_SITE_OTHER): Payer: 59 | Admitting: Medical

## 2015-08-11 VITALS — BP 112/70 | HR 76 | Wt 141.0 lb

## 2015-08-11 DIAGNOSIS — H8113 Benign paroxysmal vertigo, bilateral: Secondary | ICD-10-CM

## 2015-08-11 DIAGNOSIS — G479 Sleep disorder, unspecified: Secondary | ICD-10-CM | POA: Diagnosis not present

## 2015-08-11 DIAGNOSIS — F430 Acute stress reaction: Secondary | ICD-10-CM | POA: Diagnosis not present

## 2015-08-11 MED ORDER — MECLIZINE HCL 25 MG PO TABS: 25.0000 mg | ORAL_TABLET | Freq: Two times a day (BID) | ORAL | Status: DC

## 2015-08-11 MED ORDER — ALPRAZOLAM 0.25 MG PO TABS: 0.2500 mg | ORAL_TABLET | Freq: Two times a day (BID) | ORAL | Status: DC | PRN

## 2015-08-11 NOTE — Progress Notes (Signed)
Subjective: Chief Complaint  Patient presents with  . Dizziness    started last tuesday. took tuesday and wednesday off of work because he was so woozy. happened again thursday. is trying not to lay flat because he thinks that causes it said his anxiety maked it worse   Here for dizziness.   Last visit here 2014.   In general has been in normal state of health, feeling healthy but this past week had 2 separate episodes where he felt dizzy.   Hasn't been drinking alcohol, but feels like he has been drinking.  Feels woozy, dizzy.  Feeling of room spinning, worse lying.   First time it happened lasted a few seconds, but then came back intermittent.    Recently due to these brief episodes, he stayed out of work as he was scared to perform his job begin dizzy out of the blue.  This worsened his anxiety and he has felt panicky at times.  Feels ears get hot when this comes on.  denies ringing in the ears, no hearing loss, no allergy symptoms, no chest pain, no swelling, no SOB.  No palpitations, no feeling of skipped beats.  No nausea or vomiting, no vision change or vision loss. No numbness, no tingling, no weakness.     Past Medical History  Diagnosis Date  . Wears glasses    ROS as in subjective  Objective: BP 112/70 mmHg  Pulse 76  Wt 141 lb (63.957 kg)  General appearance: alert, no distress, WD/WN, HEENT: normocephalic, sclerae anicteric, PERRLA, EOMi, nares patent, no discharge or erythema, pharynx normal Oral cavity: MMM, no lesions Neck: supple, no lymphadenopathy, no thyromegaly, no masses Heart: RRR, normal S1, S2, no murmurs Lungs: CTA bilaterally, no wheezes, rhonchi, or rales Extremities: no edema, no cyanosis, no clubbing Pulses: 2+ symmetric, upper and lower extremities, normal cap refill Neurological: alert, oriented x 3, CN2-12 intact, strength normal upper extremities and lower extremities, sensation normal throughout, DTRs 2+ throughout, no cerebellar signs, gait  normal Psychiatric: normal affect, behavior normal, pleasant     Assessment: Encounter Diagnoses  Name Primary?  . Benign paroxysmal positional vertigo, bilateral Yes  . Acute stress reaction   . Sleep disturbance     Plan: Discussed symptoms, concerns.   Discussed symptoms of TIA, arrhythmia, MI, but advised his symptoms and exam suggest BPPV.   Begin meclizine BID, avoid sudden motions, hydrate well, and if not much improved within a week let me know.      Refilled xanax for prn use, as he is having trouble sleeping and feeling panic attacks/mild given his concern about the vertigo.    Return soon for fasting physical.  Barbara CowerJason was seen today for dizziness.  Diagnoses and all orders for this visit:  Benign paroxysmal positional vertigo, bilateral  Acute stress reaction  Sleep disturbance  Other orders -     meclizine (ANTIVERT) 25 MG tablet; Take 1 tablet (25 mg total) by mouth 2 (two) times daily. -     ALPRAZolam (XANAX) 0.25 MG tablet; Take 1 tablet (0.25 mg total) by mouth 2 (two) times daily as needed for sleep.

## 2015-08-12 ENCOUNTER — Encounter: Payer: Self-pay | Admitting: Medical

## 2016-02-16 ENCOUNTER — Telehealth: Payer: Self-pay | Admitting: Medical

## 2016-02-16 MED ORDER — ALPRAZOLAM 0.25 MG PO TABS: 0.2500 mg | ORAL_TABLET | Freq: Two times a day (BID) | ORAL | 0 refills | Status: DC | PRN

## 2016-02-16 NOTE — Telephone Encounter (Signed)
So did we call in the #10 xanax or not?

## 2016-02-16 NOTE — Telephone Encounter (Signed)
Requesting refill on Xanax 0.25 mg. Pt is having a little anxiety flare up due to drive that he has to get to work and it is affecting his sleep some. Pt does not use Xanax often and he is not able to get off work right now to come in for appt so just want med to help until he can get time off work for appt.

## 2016-02-16 NOTE — Telephone Encounter (Signed)
Call out #10 and get him in for physical

## 2016-02-16 NOTE — Telephone Encounter (Signed)
States he will set up a cpe as soon as he gets some insurance. He called to check on his rx to see if it was called in, so I tried to set him up for a cpe

## 2016-02-16 NOTE — Telephone Encounter (Signed)
CMA was going to call in

## 2016-02-24 ENCOUNTER — Telehealth: Payer: Self-pay | Admitting: Family Medicine

## 2016-02-24 NOTE — Telephone Encounter (Signed)
He is not in any danger getting the flu shot with his underlying anxiety

## 2016-02-24 NOTE — Telephone Encounter (Signed)
Pt called and wanted to know with his anxiety issues is it ok for him to have the flu shot at work?  I tried to explain to him if he is not running a fever and no sick type symptoms he should be fine.  He was still hesitant, so I told him I would ask you and let him know.  Pt ph [phone]

## 2016-02-24 NOTE — Telephone Encounter (Signed)
Pt informed word for word and he verbalized understanding

## 2016-03-22 ENCOUNTER — Encounter: Payer: 59 | Admitting: Medical

## 2016-03-29 ENCOUNTER — Ambulatory Visit (INDEPENDENT_AMBULATORY_CARE_PROVIDER_SITE_OTHER): Payer: 59 | Admitting: Medical

## 2016-03-29 ENCOUNTER — Encounter: Payer: Self-pay | Admitting: Medical

## 2016-03-29 VITALS — BP 110/70 | HR 73 | Ht 67.0 in | Wt 142.7 lb

## 2016-03-29 DIAGNOSIS — Z7185 Encounter for immunization safety counseling: Secondary | ICD-10-CM

## 2016-03-29 DIAGNOSIS — Z0000 Encounter for general adult medical examination without abnormal findings: Secondary | ICD-10-CM | POA: Insufficient documentation

## 2016-03-29 DIAGNOSIS — Z136 Encounter for screening for cardiovascular disorders: Secondary | ICD-10-CM

## 2016-03-29 DIAGNOSIS — R0600 Dyspnea, unspecified: Secondary | ICD-10-CM | POA: Insufficient documentation

## 2016-03-29 DIAGNOSIS — Z7189 Other specified counseling: Secondary | ICD-10-CM | POA: Diagnosis not present

## 2016-03-29 DIAGNOSIS — F17200 Nicotine dependence, unspecified, uncomplicated: Secondary | ICD-10-CM | POA: Diagnosis not present

## 2016-03-29 DIAGNOSIS — Z1271 Encounter for screening for malignant neoplasm of testis: Secondary | ICD-10-CM | POA: Insufficient documentation

## 2016-03-29 DIAGNOSIS — F411 Generalized anxiety disorder: Secondary | ICD-10-CM

## 2016-03-29 LAB — CBC
HCT: 46.9 % (ref 38.5–50.0)
Hemoglobin: 16.2 g/dL (ref 13.2–17.1)
MCH: 32.7 pg (ref 27.0–33.0)
MCHC: 34.5 g/dL (ref 32.0–36.0)
MCV: 94.6 fL (ref 80.0–100.0)
MPV: 10.4 fL (ref 7.5–12.5)
PLATELETS: 252 10*3/uL (ref 140–400)
RBC: 4.96 MIL/uL (ref 4.20–5.80)
RDW: 14.3 % (ref 11.0–15.0)
WBC: 5.4 10*3/uL (ref 4.0–10.5)

## 2016-03-29 LAB — POCT URINALYSIS DIPSTICK
BILIRUBIN UA: NEGATIVE
Blood, UA: NEGATIVE
GLUCOSE UA: NEGATIVE
Ketones, UA: NEGATIVE
LEUKOCYTES UA: NEGATIVE
NITRITE UA: NEGATIVE
PH UA: 7
Protein, UA: NEGATIVE
Spec Grav, UA: 1.015
Urobilinogen, UA: NEGATIVE

## 2016-03-29 LAB — LIPID PANEL
CHOL/HDL RATIO: 4.1 ratio (ref <5.0)
CHOLESTEROL: 203 mg/dL — AB (ref <200)
HDL: 49 mg/dL (ref >40)
LDL Cholesterol: 125 mg/dL — ABNORMAL HIGH
Triglycerides: 147 mg/dL (ref <150)
VLDL: 29 mg/dL (ref <30)

## 2016-03-29 LAB — COMPREHENSIVE METABOLIC PANEL
ALK PHOS: 62 U/L (ref 40–115)
ALT: 22 U/L (ref 9–46)
AST: 19 U/L (ref 10–40)
Albumin: 4.6 g/dL (ref 3.6–5.1)
BILIRUBIN TOTAL: 1.4 mg/dL — AB (ref 0.2–1.2)
BUN: 8 mg/dL (ref 7–25)
CO2: 25 mmol/L (ref 20–31)
CREATININE: 0.91 mg/dL (ref 0.60–1.35)
Calcium: 9.9 mg/dL (ref 8.6–10.3)
Chloride: 106 mmol/L (ref 98–110)
Glucose, Bld: 86 mg/dL (ref 65–99)
POTASSIUM: 4.5 mmol/L (ref 3.5–5.3)
Sodium: 138 mmol/L (ref 135–146)
TOTAL PROTEIN: 6.9 g/dL (ref 6.1–8.1)

## 2016-03-29 LAB — HEMOGLOBIN A1C
HEMOGLOBIN A1C: 5 % (ref <5.7)
MEAN PLASMA GLUCOSE: 97 mg/dL

## 2016-03-29 LAB — TSH: TSH: 1.75 mIU/L (ref 0.40–4.50)

## 2016-03-29 MED ORDER — CITALOPRAM HYDROBROMIDE 10 MG PO TABS: 10.0000 mg | ORAL_TABLET | Freq: Every day | ORAL | 2 refills | Status: DC

## 2016-03-29 MED ORDER — ALPRAZOLAM 0.25 MG PO TABS: 0.2500 mg | ORAL_TABLET | Freq: Two times a day (BID) | ORAL | 0 refills | Status: DC | PRN

## 2016-03-29 NOTE — Progress Notes (Signed)
Subjective:   HPI  Vincent Walter is a 40 y.o. male who presents for a complete physical.  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  No specific trigger.  Reviewed their medical, surgical, family, social, medication, and allergy history and updated chart as appropriate.  Past Medical History:  Diagnosis Date  . Anxiety   . Chronic back pain   . Headache syndrome    related to anxiety  . Wears glasses     Past Surgical History:  Procedure Laterality Date  . MANDIBLE FRACTURE SURGERY      Social History   Social History  . Marital status: Single    Spouse name: N/A  . Number of children: N/A  . Years of education: N/A   Occupational History  . Not on file.   Social History Main Topics  . Smoking status: Current Every Day Smoker    Packs/day: 1.00    Years: 25.00    Types: Cigarettes  . Smokeless tobacco: Never Used  . Alcohol use 4.2 oz/week    7 Cans of beer per week     Comment: occasional  . Drug use: No  . Sexual activity: Not on file   Other Topics Concern  . Not on file   Social History Narrative   Walks, has fiance, 2 daughters, no other exercise other than work.  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 Alcohol abuse Mother   . Hypertension Father   . Cancer Father     skin  . Hyperlipidemia Brother   . Anxiety disorder Daughter   . Cancer Paternal Grandfather   . Hypertension Brother   . Heart disease Neg Hx   . Other Neg Hx   . Stroke Neg Hx   . Diabetes Neg Hx      Current Outpatient Prescriptions:  .  ALPRAZolam (XANAX) 0.25 MG tablet, Take 1 tablet (0.25 mg total) by mouth 2 (two) times daily as needed for sleep., Disp: 10 tablet, Rfl: 0  No Known Allergies   Review of  Systems Constitutional: -fever, -chills, -sweats, -unexpected weight change, -decreased appetite, -fatigue Allergy: -sneezing, -itching, -congestion Dermatology: -changing moles, --rash, -lumps ENT: -runny nose, -ear pain, -sore throat, -hoarseness, -sinus pain, -teeth pain, - ringing in ears, -hearing loss, -nosebleeds Cardiology: -chest pain, -palpitations, -swelling, -difficulty breathing when lying flat, -waking up short of breath Respiratory: -cough, -shortness of breath, -difficulty breathing with exercise or exertion, -wheezing, -coughing up blood Gastroenterology: -abdominal pain, -nausea, -vomiting, -diarrhea, -constipation, -blood in stool, -changes in bowel movement, -difficulty swallowing or eating Hematology: -bleeding, -bruising  Musculoskeletal: -joint aches, -muscle aches, -joint swelling, -back pain, -neck pain, -cramping, -changes in gait Ophthalmology: denies vision changes, eye redness, itching, discharge Urology: -burning with urination, -difficulty urinating, -blood in urine, -urinary frequency, -urgency, -incontinence Neurology: -headache, -weakness, -tingling, -numbness, -memory loss, -falls, -dizziness Psychology: -depressed mood, -agitation, -sleep problems, +anxiety     Objective:   Physical Exam  BP 110/70   Pulse 73   Ht 5\' 7"  (1.702 m)   Wt 142 lb 11.2 oz (64.7 kg)   SpO2 99%   BMI 22.35 kg/m   General appearance: alert, no distress, WD/WN, white male Skin: right neck with 3-4 mm  diameter pearly raised papular lesions, somewhat depressed center, possible basal cell lesion, scattered freckles throughout, left upper cheek with brown 3mm slight raised lesion unchanged per patient for years, small brown 3mm round flat lesion in right pubic region, no other worrisome lesion HEENT: normocephalic, conjunctiva/corneas normal, sclerae anicteric, PERRLA, EOMi, nares patent, no discharge or erythema, pharynx normal Oral cavity: MMM, tongue normal, teeth normal Neck:  supple, no lymphadenopathy, no thyromegaly, no masses, normal ROM, no bruits Chest: non tender, normal shape and expansion Heart: RRR, normal S1, S2, no murmurs Lungs: CTA bilaterally, no wheezes, rhonchi, or rales Abdomen: +bs, soft, non tender, non distended, no masses, no hepatomegaly, no splenomegaly, no bruits Back: non tender, normal ROM, no scoliosis Musculoskeletal: upper extremities non tender, no obvious deformity, normal ROM throughout, lower extremities non tender, no obvious deformity, normal ROM throughout Extremities: no edema, no cyanosis, no clubbing Pulses: 2+ symmetric, upper and lower extremities, normal cap refill Neurological: alert, oriented x 3, CN2-12 intact, strength normal upper extremities and lower extremities, sensation normal throughout, DTRs 2+ throughout, no cerebellar signs, gait normal Psychiatric: normal affect, behavior normal, pleasant  GU: normal male external genitalia, circumcised, non tender, no masses, no hernia, no lymphadenopathy Rectal: deferred   Adult ECG Report  Indication: smoker, dyspnea, screen for exercise  Rate: 59 bpm  Rhythm: sinus bradycardia  QRS Axis: 76 degrees  PR Interval:  QRS Duration: 90ms  QTc:  Conduction Disturbances: none  Other Abnormalities: none  Patient's cardiac risk factors are: male gender and smoking/ tobacco exposure.  EKG comparison: 2014  Narrative Interpretation: no acute change   Assessment and Plan :    Encounter Diagnoses  Name Primary?  . Routine general medical examination at a health care facility Yes  . Screening for heart disease   . Smoker   . Dyspnea, unspecified type   . Vaccine counseling   . Screening for testicular cancer   . Generalized anxiety disorder      Physical exam - discussed healthy lifestyle, diet, exercise, preventative care, vaccinations, and addressed their concerns.   He will return for Td and pneumococcal vaccines when his wife will bring him.  He is  very anxious in general about things, wants to wait until wife can bring him. He is up to date on flu shot. Discussed the mildly abnormal PFTs showing mild obstruction.  Recommendations:  STOP smoking! See your eye doctor yearly for routine vision care.  See your dentist yearly for routine dental care including hygiene visits twice yearly.  Do a monthly self testicular exam  Return at your convenience soon for biopsy of the lesion on the right neck  Return at your convenience for Td tetanus booster and Pneumonia vaccine  Exercise 30 minutes or more, most days of the week.  Start gradually and increase your exercise tolerance over the next few weeks  Eat a healthy low fat diet, limit fried foods, fast food, processed foods  Return yearly for physical  Begin Citalopram daily at bedtime to help with anxiety  Go see a counselor or psychologist   Follow-up pending labs, return for biopsy of right neck lesion,      Vincent Walter was seen today for physical.  Diagnoses and all orders for this visit:  Routine general medical examination at a health care facility -     Urinalysis Dipstick -     CBC -     Comprehensive metabolic panel -     Lipid panel -  TSH -     Hemoglobin A1c -     EKG 12-Lead  Screening for heart disease -     Lipid panel -     EKG 12-Lead  Smoker -     EKG 12-Lead  Dyspnea, unspecified type -     EKG 12-Lead  Vaccine counseling  Screening for testicular cancer  Generalized anxiety disorder

## 2016-03-29 NOTE — Patient Instructions (Signed)
Encounter Diagnoses  Name Primary?  . Routine general medical examination at a health care facility Yes  . Screening for heart disease   . Smoker   . Dyspnea, unspecified type   . Vaccine counseling   . Screening for testicular cancer   . Generalized anxiety disorder    Recommendations:  STOP smoking! See your eye doctor yearly for routine vision care.  See your dentist yearly for routine dental care including hygiene visits twice yearly.  Return at your convenience soon for biopsy of the lesion on the right neck  Return at your convenience for Td tetanus booster and Pneumonia vaccine  Exercise 30 minutes or more, most days of the week.  Start gradually and increase your exercise tolerance over the next few weeks  Eat a healthy low fat diet, limit fried foods, fast food, processed foods  Return yearly for physical  Begin Citalopram daily at bedtime to help with anxiety  Go see a counselor or psychologist   I recommend the following options:  [facility] [phone] office [email or web address] [address] [place], [place], [place] [address]  Franchot ErichsenErik Nelson, MA, clinical psychologist  Cognitive-Behavior Therapy; Mood Disorders; Anxiety Disorders; adult and child ADHD; Family Therapy; Stress Management; personal growth, and Marital Therapy.    Carlus Pavlovennis McKnight Ph.D., clinical psychologist Cognitive-Behavior Therapy; Mood Disorders; Anxiety Disorders; Stress Management   [facility] [address], [place], [place] [address] [phone]    [facility] [address], [place], [place] [address] [phone]) [phone]

## 2016-03-30 ENCOUNTER — Other Ambulatory Visit: Payer: Self-pay | Admitting: Medical

## 2016-04-17 ENCOUNTER — Ambulatory Visit (INDEPENDENT_AMBULATORY_CARE_PROVIDER_SITE_OTHER): Payer: 59 | Admitting: Medical

## 2016-04-17 ENCOUNTER — Encounter: Payer: Self-pay | Admitting: Medical

## 2016-04-17 VITALS — BP 110/68 | HR 77 | Wt 145.6 lb

## 2016-04-17 DIAGNOSIS — S39012A Strain of muscle, fascia and tendon of lower back, initial encounter: Secondary | ICD-10-CM | POA: Diagnosis not present

## 2016-04-17 DIAGNOSIS — G8929 Other chronic pain: Secondary | ICD-10-CM

## 2016-04-17 DIAGNOSIS — M5440 Lumbago with sciatica, unspecified side: Secondary | ICD-10-CM

## 2016-04-17 DIAGNOSIS — M62830 Muscle spasm of back: Secondary | ICD-10-CM

## 2016-04-17 MED ORDER — CYCLOBENZAPRINE HCL 10 MG PO TABS: 10.0000 mg | ORAL_TABLET | Freq: Every day | ORAL | 0 refills | Status: DC

## 2016-04-17 MED ORDER — DICLOFENAC SODIUM 75 MG PO TBEC: 75.0000 mg | DELAYED_RELEASE_TABLET | Freq: Two times a day (BID) | ORAL | 0 refills | Status: DC

## 2016-04-17 NOTE — Progress Notes (Signed)
Subjective: Chief Complaint  Patient presents with  . back pain    back pain x3 days, pick up tv    Here for c/o back pain.  He was picking up a tv on Thursday, 04/13/16.   Awoke Friday with some back pain that worsened in the left low back with pain down leg to knee on left.  Leg feels tingly at times, sometimes dull/numb.   Was lifting tv by himself.  55" tv, flat screen.  Was setting in on a cart.  Felt the pain the next day.  Pain is a little worse than last week.  Been at work all day today which aggravated the pain.   Using some aleve OTC.   Used some biofreeze topically, ben gay.  He does note some ongoing back pain for years.   Has seen chiropractor in the past.  Was advised he had some hip misalignment in the past.  No incontinence.   No urinary or bowel changes.  No other aggravating or relieving factors. No other complaint.  Past Medical History:  Diagnosis Date  . Anxiety   . Chronic back pain   . Headache syndrome    related to anxiety  . Wears glasses    Current Outpatient Prescriptions on File Prior to Visit  Medication Sig Dispense Refill  . ALPRAZolam (XANAX) 0.25 MG tablet Take 1 tablet (0.25 mg total) by mouth 2 (two) times daily as needed for sleep. 30 tablet 0  . citalopram (CELEXA) 10 MG tablet Take 1 tablet (10 mg total) by mouth daily. 30 tablet 2   No current facility-administered medications on file prior to visit.    Past Surgical History:  Procedure Laterality Date  . MANDIBLE FRACTURE SURGERY     ROS as in subjective  Objective: BP 110/68   Pulse 77   Wt 145 lb 9.6 oz (66 kg)   SpO2 98%   BMI 22.80 kg/m   Gen: wd, wn, nad Tender left lumbar paraspinal region and over SI joint.  There is possible left leg shorter than right, slight misalignment noted, otherwise nontender, normal ROM except causing some pain.  No mild line tenderness.  No deformity Arms and legs nontender, normal ROM, no deformity No ext edema Pulses WNL Legs with normal strength,  sensation, DTRs, and normal heel and toe walk   Assessment: Encounter Diagnoses  Name Primary?  . Back spasm Yes  . Back strain, initial encounter   . Chronic low back pain with sciatica, sciatica laterality unspecified, unspecified back pain laterality      Plan: Offered xray given chronic back pains in general.  He declines.   For now use 4-7 days of relative rest, no heavy lifting, do ROM and gentle stretching, medications below, and if worse or not improving in the next week, then recheck. Advised strengthening and stretching routine daily to prevent back troubles .  Barbara CowerJason was seen today for back pain.  Diagnoses and all orders for this visit:  Back spasm  Back strain, initial encounter  Chronic low back pain with sciatica, sciatica laterality unspecified, unspecified back pain laterality  Other orders -     cyclobenzaprine (FLEXERIL) 10 MG tablet; Take 1 tablet (10 mg total) by mouth at bedtime. -     diclofenac (VOLTAREN) 75 MG EC tablet; Take 1 tablet (75 mg total) by mouth 2 (two) times daily.

## 2016-06-10 ENCOUNTER — Other Ambulatory Visit: Payer: Self-pay | Admitting: Medical

## 2016-06-12 NOTE — Telephone Encounter (Signed)
Can he have a refill on this. 

## 2016-06-22 ENCOUNTER — Encounter: Payer: Self-pay | Admitting: Family Medicine

## 2016-06-22 ENCOUNTER — Ambulatory Visit (INDEPENDENT_AMBULATORY_CARE_PROVIDER_SITE_OTHER): Payer: 59 | Admitting: Family Medicine

## 2016-06-22 VITALS — BP 124/76 | HR 74 | Temp 98.2°F | Wt 142.8 lb

## 2016-06-22 DIAGNOSIS — F410 Panic disorder [episodic paroxysmal anxiety] without agoraphobia: Secondary | ICD-10-CM

## 2016-06-22 DIAGNOSIS — F411 Generalized anxiety disorder: Secondary | ICD-10-CM | POA: Diagnosis not present

## 2016-06-22 MED ORDER — ALPRAZOLAM 0.25 MG PO TABS: 0.2500 mg | ORAL_TABLET | Freq: Two times a day (BID) | ORAL | 0 refills | Status: DC | PRN

## 2016-06-22 NOTE — Progress Notes (Signed)
Subjective:    Patient ID: Vincent Walter, male    DOB: Mar 14, 1976, 41 y.o.   MRN: [id number]  HPI Chief Complaint  Patient presents with  . aniexty    aniexty- does not fully black out and but driving makes him have like panic attacks, went to work and had like panic attack. suppose to be taking citapram but only as needed   He is here with his wife to discuss complaints of anxiety and panic attacks when driving. Reports feeling a sharp pain on the left side of his neck prior to the onset of panic attack. Also reports warmth in his head, bilateral frontal headache and feeling numb and overwhelmed. States he was diagnosed with anxiety 1 1/2 years ago. States he only has anxiety attacks with driving. States this started 4-5 years ago. States this started after one of his friends died.  His wife has to drive him if she is available. States he lives 45 minutes from work.  Last panic attack was this morning. States he took a Xanax and his symptoms completely resolved and have not returned. Requests refill of medication.   He did discuss anxiety and panic attacks with his PCP, Vincenza Hews, in November and was prescribed citalopram and referred to counseling. He did not start either plan. States he took his first citalopram last night and plans to continue taking it. He is in agreement to start counseling as well.  He does drink beer but has been drinking more water lately.   Reviewed allergies, medications, past medical,  and social history.    Review of Systems Pertinent positives and negatives in the history of present illness.     Objective:   Physical Exam  Constitutional: He is oriented to person, place, and time. He appears well-developed and well-nourished. No distress.  HENT:  Right Ear: Tympanic membrane and ear canal normal.  Left Ear: Tympanic membrane and ear canal normal.  Nose: Nose normal. Right sinus exhibits no maxillary sinus tenderness and no frontal sinus tenderness. Left  sinus exhibits no maxillary sinus tenderness and no frontal sinus tenderness.  Mouth/Throat: Uvula is midline, oropharynx is clear and moist and mucous membranes are normal.  Eyes: Conjunctivae, EOM and lids are normal. Pupils are equal, round, and reactive to light.  Neck: Full passive range of motion without pain. Neck supple. No muscular tenderness present. No thyromegaly present.  Cardiovascular: Normal rate, regular rhythm, normal heart sounds and normal pulses.   Pulmonary/Chest: Effort normal and breath sounds normal.  Lymphadenopathy:    He has no cervical adenopathy.  Neurological: He is alert and oriented to person, place, and time. He has normal strength and normal reflexes. He displays no tremor. No cranial nerve deficit or sensory deficit. He displays a negative Romberg sign. Coordination and gait normal. GCS eye subscore is 4. GCS verbal subscore is 5. GCS motor subscore is 6.  Skin: Skin is warm and dry. No rash noted. No pallor.  Psychiatric: He has a normal mood and affect. His speech is normal and behavior is normal. Judgment and thought content normal. Cognition and memory are normal.   BP 124/76 (BP Location: Right Arm, Patient Position: Standing, Cuff Size: Normal)   Pulse 74   Temp 98.2 F (36.8 C) (Oral)   Wt 142 lb 12.8 oz (64.8 kg)   BMI 22.37 kg/m       Assessment & Plan:  Panic attacks  Generalized anxiety disorder  Neuro exam is normal. His symptoms appear to be  related to panic attacks and he appears to be having them more often and they are now affecting his job and relationships. His symptoms resolved after taking Xanax. He decided to start taking Citalopram last night. This was prescribed by Vincenza HewsShane, his PCP, 2 months ago.  Refilled Xanax and he is aware that this medication is for temporary use and that counseling is what will help him change his thinking.  Recommend he take citalopram daily and see a counselor.  He will follow up with Vincenza HewsShane for anxiety.

## 2016-06-22 NOTE — Patient Instructions (Signed)
Take the citalopram daily and make an appointment to see a counselor.   You can call to schedule your appointment with the Counselor. A couple offices are listed below for you to call.    [facility]  [address], [place], [place] [address] Phone: [phone]    [facility]  [address], [place], [place] [address]  Phone: [phone]  [facility] [address] [place], [place] [address]  Phone: [phone]

## 2016-06-27 ENCOUNTER — Telehealth: Payer: Self-pay | Admitting: Family Medicine

## 2016-06-27 ENCOUNTER — Other Ambulatory Visit: Payer: Self-pay | Admitting: Medical

## 2016-06-27 MED ORDER — PAROXETINE HCL 10 MG PO TABS: 10.0000 mg | ORAL_TABLET | Freq: Every day | ORAL | 1 refills | Status: DC

## 2016-06-27 NOTE — Telephone Encounter (Signed)
Pt notified of recommendations and verbalized understanding of med changes.

## 2016-06-27 NOTE — Telephone Encounter (Signed)
Ok.  Lets try a different medication.  I sent Paxil to pharmacy, low dose.    Have him use 1/2 citalopram for the next 3 nights, then on the 4th day, begin Paxil in the MORNING daily.  Lets give this a try instead.

## 2016-06-27 NOTE — Telephone Encounter (Signed)
Pt states he sleeps all the time on the Celexa.  He has been sleeping for 4 days, still having headaches. He does not have the money to come in again.  Wants to know if you will call him [phone]

## 2016-07-05 ENCOUNTER — Ambulatory Visit (INDEPENDENT_AMBULATORY_CARE_PROVIDER_SITE_OTHER): Payer: 59 | Admitting: Medical

## 2016-07-05 ENCOUNTER — Encounter: Payer: Self-pay | Admitting: Medical

## 2016-07-05 ENCOUNTER — Telehealth: Payer: Self-pay | Admitting: Medical

## 2016-07-05 VITALS — BP 110/60 | Wt 145.6 lb

## 2016-07-05 DIAGNOSIS — R42 Dizziness and giddiness: Secondary | ICD-10-CM | POA: Insufficient documentation

## 2016-07-05 DIAGNOSIS — R51 Headache: Secondary | ICD-10-CM | POA: Diagnosis not present

## 2016-07-05 DIAGNOSIS — G44209 Tension-type headache, unspecified, not intractable: Secondary | ICD-10-CM | POA: Insufficient documentation

## 2016-07-05 DIAGNOSIS — R519 Headache, unspecified: Secondary | ICD-10-CM | POA: Insufficient documentation

## 2016-07-05 NOTE — Telephone Encounter (Signed)
Refer to Dr. Karel JarvisAquino for daily headache, dizziness.

## 2016-07-05 NOTE — Progress Notes (Signed)
Subjective: Chief Complaint  Patient presents with  . headaches, ear pain    headaches and ear pain    Here for f/u on headaches.  Saw Vickie here recently for some of the same symptoms.  He notes for weeks having daily headaches.  They start as aches in back of neck and gradually come up behind ear to bitemporal region.  Sometimes throbbing headaches.  No sharp headaches.  No fever, no paresthesia, no vision or hearing changes, no weakness, no speech changes.  No teeth pain, no URI symptoms.   He does still get some dizziness from time to time.  No nausea.  Using OTC analgesic as needed for headache, using meclizine prn for dizziness.  No other aggravating or relieving factors. No other complaint.  Past Medical History:  Diagnosis Date  . Anxiety   . Chronic back pain   . Headache syndrome    related to anxiety  . Wears glasses    Current Outpatient Prescriptions on File Prior to Visit  Medication Sig Dispense Refill  . ALPRAZolam (XANAX) 0.25 MG tablet Take 1 tablet (0.25 mg total) by mouth 2 (two) times daily as needed for sleep. 30 tablet 0  . cyclobenzaprine (FLEXERIL) 10 MG tablet TAKE 1 TABLET (10 MG TOTAL) BY MOUTH AT BEDTIME. 30 tablet 0  . diclofenac (VOLTAREN) 75 MG EC tablet TAKE 1 TABLET (75 MG TOTAL) BY MOUTH 2 (TWO) TIMES DAILY. 60 tablet 0   No current facility-administered medications on file prior to visit.    ROS as in subjective   Objective: BP 110/60   Wt 145 lb 9.6 oz (66 kg)   SpO2 98%   BMI 22.80 kg/m   General appearance: alert, no distress, WD/WN,  HEENT: normocephalic, sclerae anicteric, PERRLA, EOMi, no TMJ tenderness, nares patent, no discharge or erythema, pharynx normal Oral cavity: MMM, some gingival disease noted, no other lesions Neck: supple, no lymphadenopathy, no thyromegaly, no masses, no bruits Heart: RRR, normal S1, S2, no murmurs Lungs: CTA bilaterally, no wheezes, rhonchi, or rales Back: non tender Extremities: no edema, no cyanosis,  no clubbing Pulses: 2+ symmetric, upper and lower extremities, normal cap refill Neurological: alert, oriented x 3, CN2-12 intact, strength normal upper extremities and lower extremities, sensation normal throughout, DTRs 2+ throughout, no cerebellar signs, gait normal Psychiatric: normal affect, behavior normal, pleasant    Assessment: Encounter Diagnoses  Name Primary?  . Daily headache Yes  . Dizzy spells   . Tension headache     Plan: Etiology seems to be tension headaches, somewhat triggers by stress or other.   Given his ongoing symptoms, referral to neurology.  Advised short term trial of Flexeril QHS, go get a massage, reduce stress where possible, hydrate well, work on doing daily stretching routine, regular exercise, limit caffeine intake.  F/u pending neurology referral  Barbara CowerJason was seen today for headaches, ear pain.  Diagnoses and all orders for this visit:  Daily headache -     Ambulatory referral to Neurology  Dizzy spells  Tension headache

## 2016-07-05 NOTE — Patient Instructions (Signed)
Massage Therapy:  Vincent HintJanet Walter [facility] [address] [place], [place] [address] [phone] [email or web address]   Tension Headache A tension headache is a feeling of pain, pressure, or aching that is often felt over the front and sides of the head. The pain can be dull, or it can feel tight (constricting). Tension headaches are not normally associated with nausea or vomiting, and they do not get worse with physical activity. Tension headaches can last from 30 minutes to several days. This is the most common type of headache. CAUSES The exact cause of this condition is not known. Tension headaches often begin after stress, anxiety, or depression. Other triggers may include:  Alcohol.  Too much caffeine, or caffeine withdrawal.  Respiratory infections, such as colds, flu, or sinus infections.  Dental problems or teeth clenching.  Fatigue.  Holding your head and neck in the same position for a long period of time, such as while using a computer.  Smoking. SYMPTOMS Symptoms of this condition include:  A feeling of pressure around the head.  Dull, aching head pain.  Pain felt over the front and sides of the head.  Tenderness in the muscles of the head, neck, and shoulders. DIAGNOSIS This condition may be diagnosed based on your symptoms and a physical exam. Tests may be done, such as a CT scan or an MRI of your head. These tests may be done if your symptoms are severe or unusual. TREATMENT This condition may be treated with lifestyle changes and medicines to help relieve symptoms. HOME CARE INSTRUCTIONS Managing Pain   Take over-the-counter and prescription medicines only as told by your health care provider.  Lie down in a dark, quiet room when you have a headache.  If directed, apply ice to the head and neck area:  Put ice in a plastic bag.  Place a towel between your skin and the bag.  Leave the ice on for 20 minutes, 2-3 times per  day.  Use a heating pad or a hot shower to apply heat to the head and neck area as told by your health care provider. Eating and Drinking   Eat meals on a regular schedule.  Limit alcohol use.  Decrease your caffeine intake, or stop using caffeine. General Instructions   Keep all follow-up visits as told by your health care provider. This is important.  Keep a headache journal to help find out what may trigger your headaches. For example, write down:  What you eat and drink.  How much sleep you get.  Any change to your diet or medicines.  Try massage or other relaxation techniques.  Limit stress.  Sit up straight, and avoid tensing your muscles.  Do not use tobacco products, including cigarettes, chewing tobacco, or e-cigarettes. If you need help quitting, ask your health care provider.  Exercise regularly as told by your health care provider.  Get 7-9 hours of sleep, or the amount recommended by your health care provider. SEEK MEDICAL CARE IF:  Your symptoms are not helped by medicine.  You have a headache that is different from what you normally experience.  You have nausea or you vomit.  You have a fever. SEEK IMMEDIATE MEDICAL CARE IF:  Your headache becomes severe.  You have repeated vomiting.  You have a stiff neck.  You have a loss of vision.  You have problems with speech.  You have pain in your eye or ear.  You have muscular weakness or loss of muscle control.  You lose your balance or you have trouble walking.  You feel faint or you pass out.  You have confusion. This information is not intended to replace advice given to you by your health care provider. Make sure you discuss any questions you have with your health care provider. Document Released: 05/08/2005 Document Revised: 01/27/2015 Document Reviewed: 08/31/2014 Elsevier Interactive Patient Education  2017 [employer].

## 2016-07-06 NOTE — Telephone Encounter (Signed)
I have already this yesterday. Sent referral

## 2016-08-03 ENCOUNTER — Telehealth: Payer: Self-pay | Admitting: Medical

## 2016-08-03 NOTE — Telephone Encounter (Signed)
Pt says Paroxotine seems to make him feel drowsy. He is taking it at night as instructed but still feel the effected through the day. He feels that taking both Xanax and Meclizine works best so pt would like to get refills on these two meds

## 2016-08-04 ENCOUNTER — Other Ambulatory Visit: Payer: Self-pay

## 2016-08-04 ENCOUNTER — Other Ambulatory Visit: Payer: Self-pay | Admitting: Medical

## 2016-08-04 MED ORDER — ALPRAZOLAM 0.25 MG PO TABS: 0.2500 mg | ORAL_TABLET | Freq: Two times a day (BID) | ORAL | 0 refills | Status: DC | PRN

## 2016-08-04 NOTE — Telephone Encounter (Signed)
Called and spoke with patient , and [facility] [place]. Pt wanted you to know that he has been set up with ct scan by neurologist.

## 2016-08-04 NOTE — Telephone Encounter (Signed)
Meclizine can make you sleepy, but paroxetine shouldn't.  I would recommend not stopping the paroxetine and cutting back on meclizine.    Xanax refill ready to call in

## 2016-08-04 NOTE — Telephone Encounter (Signed)
Called in xanax per TwainShane

## 2016-08-04 NOTE — Telephone Encounter (Signed)
Called in xanax per Vincenza HewsShane and Dorinda Hillarsha has talked to pt

## 2016-09-07 ENCOUNTER — Encounter: Payer: Self-pay | Admitting: *Deleted

## 2016-09-07 ENCOUNTER — Encounter: Payer: Self-pay | Admitting: Neurology

## 2016-09-07 ENCOUNTER — Ambulatory Visit (INDEPENDENT_AMBULATORY_CARE_PROVIDER_SITE_OTHER): Payer: 59 | Admitting: Neurology

## 2016-09-07 VITALS — BP 108/80 | HR 77 | Ht 68.0 in | Wt 147.5 lb

## 2016-09-07 DIAGNOSIS — G44229 Chronic tension-type headache, not intractable: Secondary | ICD-10-CM | POA: Diagnosis not present

## 2016-09-07 DIAGNOSIS — F411 Generalized anxiety disorder: Secondary | ICD-10-CM | POA: Diagnosis not present

## 2016-09-07 MED ORDER — VENLAFAXINE HCL ER 37.5 MG PO CP24: 37.5000 mg | ORAL_CAPSULE | Freq: Every day | ORAL | 0 refills | Status: DC

## 2016-09-07 NOTE — Progress Notes (Signed)
NEUROLOGY CONSULTATION NOTE  Vincent Walter MRN: [id number] DOB: 1976/01/04  Referring provider: Crosby Oyster, PA-C Primary care provider: Crosby Oyster, PA-C  Reason for consult:  headache  HISTORY OF PRESENT ILLNESS: Vincent Walter is a 41 year old male with generalized anxiety disorder, chronic back pain and history of vertigo who presents for headache.    Onset:  3 or 4 months ago.  He has uncontrolled generalized anxiety disorder but denies any particular trigger that precipitated these headaches. Location:  Starts in back of neck and radiates to the occipital region bilaterally and then sometimes to the temples Quality:  pressure Intensity:  2-3/10 Aura:  no Prodrome:  no Postdrome:  no Associated symptoms:  Sometimes feels lightheaded.  No nausea, photophobia, phonophobia or visual disturbance..   Duration:  Several hours from morning up until late afternoon when he comes home from work Frequency:  daily Frequency of abortive medication: ibuprofen every other day Triggers/exacerbating factors:  no Relieving factors:  Resting, Xanax Activity:  Able to function This is not the worse headache of his life and does not wake up with severe headache.  Past NSAIDS:  no Past analgesics:  no Past abortive triptans:  no Past muscle relaxants:  Cyclobenzaprine at bedtime Past anti-emetic:  no Past antihypertensive medications:  no Past antidepressant medications:  no Past anticonvulsant medications:  no Past vitamins/Herbal/Supplements:  no Past antihistamines/decongestants:  no Other past therapies:  no  Current NSAIDS:  ibuprofen Current analgesics:  no Current triptans:  no Current anti-emetic:  no Current muscle relaxants:  no Current anti-anxiolytic:  alprazolam 0.25mg  Current sleep aide:  alprazolam Current Antihypertensive medications:  no Current Antidepressant medications:  no Current Anticonvulsant medications:  no Current Vitamins/Herbal/Supplements:   no Current Antihistamines/Decongestants:  no Other therapy:  no  Caffeine:  Tea once in awhile Alcohol:  infrequently Smoker:  no Diet:  hydrates Exercise:  no Depression/anxiety:  Significant anxiety.  On one occasion, he says he passed out for a couple of seconds during a panic attack. Sleep hygiene:  poor Family history of headache:  No  His PCP diagnosed him with tension headaches.  03/29/16 LABS:  CBC with WBC 5.4, HGB 16.2, HCT 46.9 and PLT 252; CMP with Na 138, K 4.5, Cl 106, CO2 25, glucose 86, BUN 8, Cr 0.91, total bili 1.4, ALP 62, AST 19 and ALT 22.  PAST MEDICAL HISTORY: Past Medical History:  Diagnosis Date  . Anxiety   . Chronic back pain   . Headache syndrome    related to anxiety  . Wears glasses     PAST SURGICAL HISTORY: Past Surgical History:  Procedure Laterality Date  . MANDIBLE FRACTURE SURGERY      MEDICATIONS: Current Outpatient Prescriptions on File Prior to Visit  Medication Sig Dispense Refill  . ALPRAZolam (XANAX) 0.25 MG tablet Take 1 tablet (0.25 mg total) by mouth 2 (two) times daily as needed for sleep. 30 tablet 0  . cyclobenzaprine (FLEXERIL) 10 MG tablet TAKE 1 TABLET (10 MG TOTAL) BY MOUTH AT BEDTIME. (Patient not taking: Reported on 09/07/2016) 30 tablet 0  . diclofenac (VOLTAREN) 75 MG EC tablet TAKE 1 TABLET (75 MG TOTAL) BY MOUTH 2 (TWO) TIMES DAILY. (Patient not taking: Reported on 09/07/2016) 60 tablet 0   No current facility-administered medications on file prior to visit.     ALLERGIES: No Known Allergies  FAMILY HISTORY: Family History  Problem Relation Age of Onset  . Alcohol abuse Mother   . Hypertension Father   .  Cancer Father     skin  . Hyperlipidemia Brother   . Anxiety disorder Daughter   . Cancer Paternal Grandfather   . Hypertension Brother   . Heart disease Neg Hx   . Other Neg Hx   . Stroke Neg Hx   . Diabetes Neg Hx     SOCIAL HISTORY: Social History   Social History  . Marital status: Single     Spouse name: N/A  . Number of children: 1  . Years of education: GED   Occupational History  . supervisor    Social History Main Topics  . Smoking status: Current Every Day Smoker    Packs/day: 1.00    Years: 25.00    Types: Cigarettes  . Smokeless tobacco: Current User  . Alcohol use 4.2 oz/week    7 Cans of beer per week     Comment: occasional  . Drug use: No  . Sexual activity: Not on file   Other Topics Concern  . Not on file   Social History Narrative   Patient lives with wife in a one story home.  Has one child.  Supervisor at [employer].  03/2016   Education: GED    REVIEW OF SYSTEMS: Constitutional: No fevers, chills, or sweats, no generalized fatigue, change in appetite Eyes: No visual changes, double vision, eye pain Ear, nose and throat: No hearing loss, ear pain, nasal congestion, sore throat Cardiovascular: No chest pain, palpitations Respiratory:  No shortness of breath at rest or with exertion, wheezes GastrointestinaI: No nausea, vomiting, diarrhea, abdominal pain, fecal incontinence Genitourinary:  No dysuria, urinary retention or frequency Musculoskeletal:  Neck tightness Integumentary: No rash, pruritus, skin lesions Neurological: as above Psychiatric: No depression, insomnia, anxiety Endocrine: No palpitations, fatigue, diaphoresis, mood swings, change in appetite, change in weight, increased thirst Hematologic/Lymphatic:  No purpura, petechiae. Allergic/Immunologic: no itchy/runny eyes, nasal congestion, recent allergic reactions, rashes  PHYSICAL EXAM: Vitals:   09/07/16 0745  BP: 108/80  Pulse: 77   General: No acute distress.  Patient appears well-groomed.  Head:  Normocephalic/atraumatic Eyes:  fundi examined but not visualized Neck: supple, mild paraspinal tenderness, full range of motion Back: No paraspinal tenderness Heart: regular rate and rhythm Lungs: Clear to auscultation bilaterally. Vascular: No carotid bruits. Neurological  Exam: Mental status: alert and oriented to person, place, and time, recent and remote memory intact, fund of knowledge intact, attention and concentration intact, speech fluent and not dysarthric, language intact. Cranial nerves: CN I: not tested CN II: pupils equal, round and reactive to light, visual fields intact CN III, IV, VI:  full range of motion, no nystagmus, no ptosis CN V: facial sensation intact CN VII: upper and lower face symmetric CN VIII: hearing intact CN IX, X: gag intact, uvula midline CN XI: sternocleidomastoid and trapezius muscles intact CN XII: tongue midline Bulk & Tone: normal, no fasciculations. Motor:  5/5 throughout  Sensation: temperature and vibration sensation intact. Deep Tendon Reflexes:  2+ throughout, toes downgoing.  Finger to nose testing:  Without dysmetria.  Heel to shin:  Without dysmetria.  Gait:  Normal station and stride.  Able to turn and tandem walk. Romberg negative.  IMPRESSION: I agree that he has tension-type headaches.  He reports neck stiffness,which may be a trigger.  His anxiety is likely a major contributor as well.  Semiology is classic and he exhibits no abnormality on exam or red flags in history to warrant brain imaging.  PLAN: 1.  To treat both headache and  anxiety, we will start venlafaxine XR 37.5mg  daily.  We can increase dose in 4 weeks if needed. 2.  We will refer him to [facility] for counseling/therapy regarding his anxiety. 3.  Lifestyle modification:  Sleep hygiene, exercise 4.  Use ibuprofen or Excedrin Tension for abortive therapy, limited to no more than 2 days out of the week 5.  He will follow up in 3 months.  We can also consider referral to PT for neck stiffness.  Thank you for allowing me to take part in the care of this patient.  Shon Millet, DO  CC:  Crosby Oyster, PA-C

## 2016-09-07 NOTE — Patient Instructions (Signed)
1.  Start venlafaxine XR 37.5mg  daily with breakfast.  Call in 4 weeks with update and we can adjust dose if needed. 2.  We will refer you to Behavioral Medicine for anxiety 3.  Continue ibuprofen or even Excedrin Tension.  Do not use meclizine.  Limit use of pain relievers to no more than 2 days out of the week.  These medications include acetaminophen, ibuprofen, triptans and narcotics.  This will help reduce risk of rebound headaches. 4.  Be aware of common food triggers such as processed sweets, processed foods with nitrites (such as deli meat, hot dogs, sausages), foods with MSG, alcohol (such as wine), chocolate, certain cheeses, certain fruits (dried fruits, some citrus fruit), vinegar, diet soda. 4.  Avoid caffeine 5.  Routine exercise 6.  Proper sleep hygiene 7.  Stay adequately hydrated with water 8.  Keep a headache diary. 9.  Maintain proper stress management. 10.  Do not skip meals. 11.  Consider supplements:  Magnesium citrate  to  daily, riboflavin , Coenzyme Q 10  three times daily 12.  Follow up in 3 months but contact me in 4 weeks with update regarding headaches and anxiety (before needing refill of venlafaxine) and we can increase dose if needed.

## 2016-09-11 ENCOUNTER — Telehealth: Payer: Self-pay | Admitting: Medical

## 2016-09-11 NOTE — Telephone Encounter (Signed)
Pt called and is requesting a refill on his xanax [id number] [place], [place] - [address] AT [facility]

## 2016-09-11 NOTE — Telephone Encounter (Signed)
Call out #30 with refill

## 2016-09-12 ENCOUNTER — Other Ambulatory Visit: Payer: Self-pay

## 2016-09-12 MED ORDER — ALPRAZOLAM 0.25 MG PO TABS: 0.2500 mg | ORAL_TABLET | Freq: Two times a day (BID) | ORAL | 1 refills | Status: DC | PRN

## 2016-09-12 NOTE — Telephone Encounter (Signed)
Called  In rx

## 2016-10-07 ENCOUNTER — Other Ambulatory Visit: Payer: Self-pay | Admitting: Neurology

## 2016-10-23 ENCOUNTER — Telehealth: Payer: Self-pay | Admitting: Medical

## 2016-10-23 NOTE — Telephone Encounter (Signed)
Needs refill on xanax   [facility]  [place]

## 2016-10-23 NOTE — Telephone Encounter (Signed)
I reviewed notes from neurology consult.  The notes said he was referred to counseling.  Who is his counseling and how often is he seeing them?

## 2016-10-24 ENCOUNTER — Telehealth: Payer: Self-pay

## 2016-10-24 NOTE — Telephone Encounter (Signed)
PT states that he has not been able to see a counselor yet. He is having financial issues. Please advise. Pt states that he takes the xanax with anxiety. He is exercising to help reduce stress.  Trixie Rude/RLB

## 2016-10-24 NOTE — Telephone Encounter (Signed)
LMTCB. /RLB 

## 2016-10-25 NOTE — Telephone Encounter (Signed)
Call out #30 xanax.  However, keep in mind that neurologist felt his headaches are in part related to anxiety.  I agree he needs to see counseling, and he should pursue this.   Venlafaxine was also recently started by headache clinic to help with anxiety and headaches.  The xanax is to help worse panic feelings, but shouldn't really be relied upon all the time.

## 2016-10-25 NOTE — Telephone Encounter (Signed)
Called in refill to pharmacy 

## 2016-11-22 ENCOUNTER — Other Ambulatory Visit: Payer: Self-pay | Admitting: Neurology

## 2016-11-27 ENCOUNTER — Telehealth: Payer: Self-pay | Admitting: Medical

## 2016-11-27 NOTE — Telephone Encounter (Signed)
Pt left a voice mail stating he needs a refill on Xanax. Please send to [facility]. Pt can be reached at [phone].

## 2016-11-28 NOTE — Telephone Encounter (Signed)
Pt was called and appt made.

## 2016-11-28 NOTE — Telephone Encounter (Signed)
Lets go ahead and get him in for f/u and he have him request his counselor send over office notes from recent visit and sign HIPPA on both ends.

## 2016-11-29 ENCOUNTER — Encounter: Payer: Self-pay | Admitting: Medical

## 2016-11-29 ENCOUNTER — Ambulatory Visit (INDEPENDENT_AMBULATORY_CARE_PROVIDER_SITE_OTHER): Payer: 59 | Admitting: Medical

## 2016-11-29 VITALS — BP 116/80 | HR 67 | Wt 148.8 lb

## 2016-11-29 DIAGNOSIS — R519 Headache, unspecified: Secondary | ICD-10-CM | POA: Insufficient documentation

## 2016-11-29 DIAGNOSIS — R51 Headache: Secondary | ICD-10-CM

## 2016-11-29 DIAGNOSIS — F40248 Other situational type phobia: Secondary | ICD-10-CM | POA: Diagnosis not present

## 2016-11-29 DIAGNOSIS — G8929 Other chronic pain: Secondary | ICD-10-CM | POA: Insufficient documentation

## 2016-11-29 DIAGNOSIS — F411 Generalized anxiety disorder: Secondary | ICD-10-CM

## 2016-11-29 DIAGNOSIS — B356 Tinea cruris: Secondary | ICD-10-CM

## 2016-11-29 MED ORDER — ALPRAZOLAM 0.25 MG PO TABS: 0.2500 mg | ORAL_TABLET | Freq: Two times a day (BID) | ORAL | 1 refills | Status: DC | PRN

## 2016-11-29 MED ORDER — CLOTRIMAZOLE-BETAMETHASONE 1-0.05 % EX CREA: 1.0000 "application " | TOPICAL_CREAM | Freq: Two times a day (BID) | CUTANEOUS | 0 refills | Status: DC

## 2016-11-29 NOTE — Progress Notes (Signed)
Subjective: Chief Complaint  Patient presents with  . Follow-up    follow up anxitey meds.   Here for f/u on anxiety and headaches.  Since last visit earlier in the year, doing ok.  Headaches have been much less frequent.  He did see neurology few months ago.   He was offered Effexor, but he only used it short term and didn't like the way it may him feel and wasn't consistent with it.    He has not seen counseling or PT for neck tension.    He notes that he uses xanax prn, typically in the morning on some days headed to work.   It helps with his anxiety that sometimes occurs when he gets in the car or before he leaves.   He thinks it could be related to being in the car, worse if by himself driving.  He doesn't necessary think his anxiety is related to work or home life, but not sure.   On days off or in the evenings he doesn't really have to use xanax as he doesn't feel anxious then.  Work is stable, steady, and he is exercising.  Feels like the xanax gives him great benefit on mornings he feels anxious.  He also notes jock itch rash for a few weeks.   Wants cream for this.  Past Medical History:  Diagnosis Date  . Anxiety   . Chronic back pain   . Headache syndrome    related to anxiety  . Wears glasses    No current outpatient prescriptions on file prior to visit.   No current facility-administered medications on file prior to visit.     ROS as in subjective   Object rive: BP 116/80   Pulse 67   Wt 148 lb 12.8 oz (67.5 kg)   SpO2 98%   BMI 22.62 kg/m   Gen: wd, wn, nad Psych: pleasant, good eye contact, answers questions approprietly Skin: mild contiguous rash of both left and right intertriginous area adjacent to scrotum bilat   Assessment: Encounter Diagnoses  Name Primary?  . Generalized anxiety disorder Yes  . Situational phobia   . Tinea cruris   . Chronic nonintractable headache, unspecified headache type     Plan: Anxiety, phobia - advised he establish  with counseling.  C/t xanax prn, but work on other coping skills as discussed.  Tinea cruris - begin cream below and recheck if not resolved within 2 wk.  Chronic headaches - improved of late, c/t efforts to limit stress.  C/t routine exercise, healthy diet.  Barbara CowerJason was seen today for follow-up.  Diagnoses and all orders for this visit:  Generalized anxiety disorder  Situational phobia  Tinea cruris  Chronic nonintractable headache, unspecified headache type  Other orders -     ALPRAZolam (XANAX) 0.25 MG tablet; Take 1 tablet (0.25 mg total) by mouth 2 (two) times daily as needed for sleep. -     clotrimazole-betamethasone (LOTRISONE) cream; Apply 1 application topically 2 (two) times daily.

## 2016-12-01 ENCOUNTER — Ambulatory Visit: Payer: 59 | Admitting: Neurology

## 2016-12-29 ENCOUNTER — Telehealth: Payer: Self-pay | Admitting: Medical

## 2016-12-29 NOTE — Telephone Encounter (Signed)
Pt left message needs refill Xanax

## 2016-12-31 NOTE — Telephone Encounter (Signed)
Shouldn't need refill.   I refilled a month ago + 1 refill

## 2017-01-01 NOTE — Telephone Encounter (Signed)
Called pharmacy & pt has refill, called pt and informed

## 2017-03-08 ENCOUNTER — Other Ambulatory Visit: Payer: Self-pay | Admitting: Medical

## 2017-03-08 NOTE — Telephone Encounter (Signed)
Can pt have refill on this 

## 2017-03-09 NOTE — Telephone Encounter (Signed)
pls call out refill 

## 2017-04-10 ENCOUNTER — Other Ambulatory Visit: Payer: Self-pay | Admitting: Medical

## 2017-04-10 NOTE — Telephone Encounter (Signed)
Can pt have a refill on this 

## 2017-04-10 NOTE — Telephone Encounter (Signed)
Call it out 

## 2017-05-09 ENCOUNTER — Telehealth: Payer: Self-pay

## 2017-05-09 NOTE — Telephone Encounter (Signed)
Pt called to let you know he started a new job and will be attending counseling through his job. He is not able to take a day off for 60 days. He would [facility] Trixie Rude/RLB

## 2017-05-10 ENCOUNTER — Other Ambulatory Visit: Payer: Self-pay | Admitting: Medical

## 2017-05-10 MED ORDER — ALPRAZOLAM 0.25 MG PO TABS: 0.2500 mg | ORAL_TABLET | Freq: Two times a day (BID) | ORAL | 2 refills | Status: DC | PRN

## 2017-05-10 NOTE — Telephone Encounter (Signed)
No, #30, with 2 refills.

## 2017-05-10 NOTE — Telephone Encounter (Signed)
Is this #30 with 2 refills or #60 pt is requesting # 60

## 2017-05-10 NOTE — Telephone Encounter (Signed)
Call out with 2 refills

## 2017-05-11 NOTE — Telephone Encounter (Signed)
done

## 2017-07-11 ENCOUNTER — Encounter: Payer: Self-pay | Admitting: Medical

## 2017-07-11 ENCOUNTER — Ambulatory Visit: Payer: Managed Care, Other (non HMO) | Admitting: Medical

## 2017-07-11 VITALS — Wt 145.0 lb

## 2017-07-11 DIAGNOSIS — R252 Cramp and spasm: Secondary | ICD-10-CM | POA: Diagnosis not present

## 2017-07-11 DIAGNOSIS — R42 Dizziness and giddiness: Secondary | ICD-10-CM

## 2017-07-11 LAB — BASIC METABOLIC PANEL
BUN/Creatinine Ratio: 14 (ref 9–20)
BUN: 12 mg/dL (ref 6–24)
CALCIUM: 9.8 mg/dL (ref 8.7–10.2)
CHLORIDE: 103 mmol/L (ref 96–106)
CO2: 23 mmol/L (ref 20–29)
Creatinine, Ser: 0.87 mg/dL (ref 0.76–1.27)
GFR calc non Af Amer: 106 mL/min/{1.73_m2} (ref >59)
GFR, EST AFRICAN AMERICAN: 123 mL/min/{1.73_m2} (ref >59)
Glucose: 81 mg/dL (ref 65–99)
POTASSIUM: 4.7 mmol/L (ref 3.5–5.2)
Sodium: 138 mmol/L (ref 134–144)

## 2017-07-11 LAB — CBC WITH DIFFERENTIAL/PLATELET
BASOS ABS: 0 10*3/uL (ref 0.0–0.2)
Basos: 1 %
EOS (ABSOLUTE): 0.2 10*3/uL (ref 0.0–0.4)
Eos: 3 %
Hematocrit: 46.4 % (ref 37.5–51.0)
Hemoglobin: 15.8 g/dL (ref 13.0–17.7)
IMMATURE GRANULOCYTES: 0 %
Immature Grans (Abs): 0 10*3/uL (ref 0.0–0.1)
Lymphocytes Absolute: 2.3 10*3/uL (ref 0.7–3.1)
Lymphs: 39 %
MCH: 31.8 pg (ref 26.6–33.0)
MCHC: 34.1 g/dL (ref 31.5–35.7)
MCV: 93 fL (ref 79–97)
MONOS ABS: 0.8 10*3/uL (ref 0.1–0.9)
Monocytes: 13 %
NEUTROS PCT: 44 %
Neutrophils Absolute: 2.6 10*3/uL (ref 1.4–7.0)
PLATELETS: 238 10*3/uL (ref 150–379)
RBC: 4.97 x10E6/uL (ref 4.14–5.80)
RDW: 13.8 % (ref 12.3–15.4)
WBC: 6 10*3/uL (ref 3.4–10.8)

## 2017-07-11 MED ORDER — MECLIZINE HCL 25 MG PO TABS: 25.0000 mg | ORAL_TABLET | Freq: Two times a day (BID) | ORAL | 1 refills | Status: DC

## 2017-07-11 NOTE — Progress Notes (Signed)
Subjective: Chief Complaint  Patient presents with  . dizziness    vertigo  this as happen in past , feeling tried    Here for dizziness.  Woke up this weekend with an episode of vertigo, dizziness.    Last night felt bad, on way home from work felt some dizziness and lightheaded again.  Has felt weak today, ongoing dizziness on and off the last 24 hours.  Changed jobs since last visit, feels less stressed since last visit.  Doing well overall.  Working to get counselor with his new insurance.   No URI symptoms, no chest pain, no leg swelling, no NVD.  Took some dramamine but this made him sleepy.   Meclizine prescription doesn't usually make him sleepy.     Past Medical History:  Diagnosis Date  . Anxiety   . Chronic back pain   . Headache syndrome    related to anxiety  . Wears glasses    Current Outpatient Medications on File Prior to Visit  Medication Sig Dispense Refill  . ALPRAZolam (XANAX) 0.25 MG tablet Take 1 tablet (0.25 mg total) by mouth 2 (two) times daily as needed. 30 tablet 2  . clotrimazole-betamethasone (LOTRISONE) cream Apply 1 application topically 2 (two) times daily. 30 g 0   No current facility-administered medications on file prior to visit.    ROS as in subjective   Objective: Wt 145 lb (65.8 kg)   SpO2 99%   BMI 22.05 kg/m   Wt Readings from Last 3 Encounters:  07/11/17 145 lb (65.8 kg)  11/29/16 148 lb 12.8 oz (67.5 kg)  09/07/16 147 lb 8 oz (66.9 kg)   BP Readings from Last 3 Encounters:  11/29/16 116/80  09/07/16 108/80  07/05/16 110/60    General appearance: alert, no distress, WD/WN,  HEENT: normocephalic, sclerae anicteric, PERRLA, EOMi, nares patent, no discharge or erythema, pharynx normal Oral cavity: MMM, no lesions Neck: supple, no lymphadenopathy, no thyromegaly, no masses Heart: RRR, normal S1, S2, no murmurs Lungs: CTA bilaterally, no wheezes, rhonchi, or rales Extremities: no edema, no cyanosis, no clubbing Pulses: 2+  symmetric, upper and lower extremities, normal cap refill Neurological: alert, oriented x 3, CN2-12 intact, strength normal upper extremities and lower extremities, sensation normal throughout, DTRs 2+ throughout, no cerebellar signs, gait normal Psychiatric: normal affect, behavior normal, pleasant    Assessment: Encounter Diagnoses  Name Primary?  . Dizziness Yes  . Lightheaded   . Muscle cramp      Plan: We discussed symptoms and exam findings.  He may just have benign positional vertigo, can use meclizine as needed.  Labs today.  I asked him to do a 72-hour food diary and get this back to me.  I suspect his dietary habits may contribute to hypoglycemia or dizziness.  He does endorse skipping meals, eating junk food and not a lot of calories per time, going long periods without eating.  Barbara CowerJason was seen today for dizziness.  Diagnoses and all orders for this visit:  Dizziness -     CBC with Differential/Platelet -     Basic metabolic panel  Lightheaded -     CBC with Differential/Platelet -     Basic metabolic panel  Muscle cramp -     CBC with Differential/Platelet -     Basic metabolic panel  Other orders -     meclizine (ANTIVERT) 25 MG tablet; Take 1 tablet (25 mg total) by mouth 2 (two) times daily.

## 2017-07-11 NOTE — Patient Instructions (Signed)
recommendations  don't skip meals  Eat some fish, get out in the sun  Take a daily multivitamin  Take 1000 units of Vitamin D OTC daily  continue good water intake  Use My Fitness Pal or Livestrong app on the smart phone to do a 3 day diary and then let me know how many calories per day, how much from carbohydrates, how much from fat, how much from protein.

## 2017-07-30 ENCOUNTER — Telehealth: Payer: Self-pay

## 2017-07-30 NOTE — Telephone Encounter (Signed)
Pt called to let you know that he was not eating very well and he will try to keep record for the next few days bec

## 2017-08-07 ENCOUNTER — Other Ambulatory Visit: Payer: Self-pay | Admitting: Medical

## 2017-08-07 NOTE — Telephone Encounter (Signed)
LOV- 07/11/17 Okay to refill?  Thanks!

## 2017-11-05 ENCOUNTER — Other Ambulatory Visit: Payer: Self-pay | Admitting: Medical

## 2017-11-05 NOTE — Telephone Encounter (Signed)
Is this ok to refill?  

## 2017-12-11 ENCOUNTER — Encounter: Payer: Self-pay | Admitting: Medical

## 2017-12-11 ENCOUNTER — Ambulatory Visit: Payer: Managed Care, Other (non HMO) | Admitting: Medical

## 2017-12-11 VITALS — BP 116/74 | HR 75 | Temp 98.4°F | Resp 16 | Ht 68.0 in | Wt 142.0 lb

## 2017-12-11 DIAGNOSIS — B349 Viral infection, unspecified: Secondary | ICD-10-CM

## 2017-12-11 DIAGNOSIS — J029 Acute pharyngitis, unspecified: Secondary | ICD-10-CM | POA: Diagnosis not present

## 2017-12-11 LAB — POCT RAPID STREP A (OFFICE): RAPID STREP A SCREEN: NEGATIVE

## 2017-12-11 NOTE — Progress Notes (Signed)
Subjective: Chief Complaint  Patient presents with  . flu    cough, congestion, ears headache, chills fever X Sunday   Here for cough, congestion, headache, sweats, chills, body aches, fever x 3 days.  Has had some nausea.   No sick contacts.   Using nyquil.  No shortness of breath, no productive cough, no rash.  No diarrhea no vomiting. No other aggravating or relieving factors. No other complaint.   Past Medical History:  Diagnosis Date  . Anxiety   . Chronic back pain   . Headache syndrome    related to anxiety  . Wears glasses    Current Outpatient Medications on File Prior to Visit  Medication Sig Dispense Refill  . ALPRAZolam (XANAX) 0.25 MG tablet TAKE 1 TABLET BY MOUTH TWICE A DAY AS NEEDED FOR ANXIETY 30 tablet 1  . clotrimazole-betamethasone (LOTRISONE) cream Apply 1 application topically 2 (two) times daily. 30 g 0  . meclizine (ANTIVERT) 25 MG tablet Take 1 tablet (25 mg total) by mouth 2 (two) times daily. 30 tablet 1   No current facility-administered medications on file prior to visit.    ROS as in subjective   Objective: BP 116/74   Pulse 75   Temp 98.4 F (36.9 C) (Oral)   Resp 16   Ht 5\' 8"  (1.727 m)   Wt 142 lb (64.4 kg)   SpO2 98%   BMI 21.59 kg/m   Wt Readings from Last 3 Encounters:  12/11/17 142 lb (64.4 kg)  07/11/17 145 lb (65.8 kg)  11/29/16 148 lb 12.8 oz (67.5 kg)   General appearance: Alert, WD/WN, no distress, somewhat ill appearing                             Skin: warm, no rash, no diaphoresis                           Head: no sinus tenderness                            Eyes: conjunctiva normal, corneas clear, PERRLA                            Ears: flat TMs, external ear canals normal                          Nose: septum midline, turbinates swollen, with erythema and clear discharge             Mouth/throat: MMM, tongue normal, moderate pharyngeal erythema                           Neck: supple, no adenopathy, no thyromegaly, non  tender                          Heart: RRR, normal S1, S2, no murmurs                         Lungs: clear, no rhonchi, no wheezes, no rales                Extremities: no edema, non tender        Assessment: Encounter Diagnoses  Name Primary?  .Marland Kitchen  Sore throat Yes  . Viral syndrome      Plan: Discussed diagnosis of viral syndrome/flu like illness. Discussed supportive care including rest, hydration, OTC Tylenol or NSAID for fever, aches, and malaise.  Discussed period of contagion, self quarantine at home away from others to avoid spread of disease, discussed means of transmission, and possible complications including pneumonia.  If worse or not improving within the next 4-5 days, then call or return.  Patient voiced understanding of diagnosis, recommendations, and treatment plan.  Gave note for work.  Sergio was seen today for flu.  Diagnoses and all orders for this visit:  Sore throat -     Rapid Strep A  Viral syndrome

## 2017-12-27 ENCOUNTER — Telehealth: Payer: Self-pay | Admitting: Medical

## 2017-12-27 ENCOUNTER — Other Ambulatory Visit: Payer: Self-pay | Admitting: Medical

## 2017-12-27 MED ORDER — CLOTRIMAZOLE-BETAMETHASONE 1-0.05 % EX CREA: 1.0000 "application " | TOPICAL_CREAM | Freq: Two times a day (BID) | CUTANEOUS | 0 refills | Status: DC

## 2017-12-27 NOTE — Telephone Encounter (Signed)
Pt called requesting a refill on his Lotrisone cream pt [id number] [place], [place] - [address] [facility] and pt can be reached at [phone] if this is called In he would like a call back

## 2017-12-27 NOTE — Telephone Encounter (Signed)
Called and informed pt that rx was sent to the pharmacy °

## 2017-12-27 NOTE — Telephone Encounter (Signed)
done

## 2018-01-07 ENCOUNTER — Other Ambulatory Visit: Payer: Self-pay | Admitting: Medical

## 2018-01-07 NOTE — Telephone Encounter (Signed)
Is this ok to refill?  

## 2018-03-06 ENCOUNTER — Other Ambulatory Visit: Payer: Self-pay | Admitting: Medical

## 2018-03-06 NOTE — Telephone Encounter (Signed)
Send in #30 xanax, but get in for physical

## 2018-03-06 NOTE — Telephone Encounter (Signed)
Needs CPX appt, then refill without additional refills

## 2018-03-06 NOTE — Telephone Encounter (Signed)
Is this ok to refill?  

## 2018-03-07 NOTE — Telephone Encounter (Signed)
Pt is scheduled for cpe on November 1st. Please send in

## 2018-03-22 ENCOUNTER — Encounter: Payer: Self-pay | Admitting: Medical

## 2018-03-22 ENCOUNTER — Ambulatory Visit: Payer: Managed Care, Other (non HMO) | Admitting: Medical

## 2018-03-22 VITALS — BP 120/72 | HR 64 | Temp 98.4°F | Resp 16 | Ht 68.0 in | Wt 139.8 lb

## 2018-03-22 DIAGNOSIS — Z0000 Encounter for general adult medical examination without abnormal findings: Secondary | ICD-10-CM

## 2018-03-22 DIAGNOSIS — L989 Disorder of the skin and subcutaneous tissue, unspecified: Secondary | ICD-10-CM

## 2018-03-22 DIAGNOSIS — F17200 Nicotine dependence, unspecified, uncomplicated: Secondary | ICD-10-CM

## 2018-03-22 DIAGNOSIS — F411 Generalized anxiety disorder: Secondary | ICD-10-CM

## 2018-03-22 DIAGNOSIS — Z7189 Other specified counseling: Secondary | ICD-10-CM

## 2018-03-22 DIAGNOSIS — Z7185 Encounter for immunization safety counseling: Secondary | ICD-10-CM

## 2018-03-22 DIAGNOSIS — R42 Dizziness and giddiness: Secondary | ICD-10-CM

## 2018-03-22 LAB — POCT URINALYSIS DIP (PROADVANTAGE DEVICE)
Bilirubin, UA: NEGATIVE
Blood, UA: NEGATIVE
Glucose, UA: NEGATIVE mg/dL
Ketones, POC UA: NEGATIVE mg/dL
Leukocytes, UA: NEGATIVE
Nitrite, UA: NEGATIVE
Protein Ur, POC: NEGATIVE mg/dL
Specific Gravity, Urine: 1.015
Urobilinogen, Ur: NEGATIVE
pH, UA: 6 (ref 5.0–8.0)

## 2018-03-22 MED ORDER — ALPRAZOLAM 0.25 MG PO TABS: ORAL_TABLET | ORAL | 4 refills | Status: DC

## 2018-03-22 MED ORDER — MECLIZINE HCL 25 MG PO TABS: 25.0000 mg | ORAL_TABLET | Freq: Two times a day (BID) | ORAL | 4 refills | Status: DC

## 2018-03-22 NOTE — Progress Notes (Signed)
Subjective:   HPI  Vincent Walter is a 42 y.o. male who presents for a complete physical.  Concerns: Doing well over all  Still uses xanax for anxiety feelings, but has done much better in recent months.   He and family are living in house with 8 total people including mother in law.  They are in final stages of being ready to move into new home out in [facility].  Excited about this.  No new c/o.  Wants refill on meclizine for dizziness he gets occasionally as discussed in past.  Reviewed their medical, surgical, family, social, medication, and allergy history and updated chart as appropriate.  Past Medical History:  Diagnosis Date  . Anxiety   . Chronic back pain   . Headache syndrome    related to anxiety  . Wears glasses     Past Surgical History:  Procedure Laterality Date  . MANDIBLE FRACTURE SURGERY      Social History   Socioeconomic History  . Marital status: Single    Spouse name: Not on file  . Number of children: 1  . Years of education: GED  . Highest education level: Not on file  Occupational History  . Occupation: Merchandiser, retail  Social Needs  . Financial resource strain: Not on file  . Food insecurity:    Worry: Not on file    Inability: Not on file  . Transportation needs:    Medical: Not on file    Non-medical: Not on file  Tobacco Use  . Smoking status: Current Every Day Smoker    Packs/day: 1.00    Years: 26.00    Pack years: 26.00    Types: Cigarettes  . Smokeless tobacco: Current User  Substance and Sexual Activity  . Alcohol use: Yes    Alcohol/week: 7.0 standard drinks    Types: 7 Cans of beer per week    Comment: occasional  . Drug use: No  . Sexual activity: Not on file  Lifestyle  . Physical activity:    Days per week: Not on file    Minutes per session: Not on file  . Stress: Not on file  Relationships  . Social connections:    Talks on phone: Not on file    Gets together: Not on file    Attends religious service: Not on  file    Active member of club or organization: Not on file    Attends meetings of clubs or organizations: Not on file    Relationship status: Not on file  . Intimate partner violence:    Fear of current or ex partner: Not on file    Emotionally abused: Not on file    Physically abused: Not on file    Forced sexual activity: Not on file  Other Topics Concern  . Not on file  Social History Narrative   Married.   Has one child.  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  Walks 3-4 miles daily.  Education: GED   03/2018.     Family History  Problem Relation Age of Onset  . Alcohol abuse Mother   . Hypertension Father   . Cancer Father        skin  . Hyperlipidemia Brother   . Anxiety disorder Daughter   . Cancer Paternal Grandfather   . Hypertension Brother   . Heart disease Neg Hx   . Other Neg Hx   . Stroke Neg Hx   . Diabetes Neg Hx      Current Outpatient  Medications:  .  ALPRAZolam (XANAX) 0.25 MG tablet, TAKE 1 TABLET BY MOUTH TWICE A DAY AS NEEDED FOR ANXIETY, Disp: 30 tablet, Rfl: 4 .  clotrimazole-betamethasone (LOTRISONE) cream, Apply 1 application topically 2 (two) times daily., Disp: 30 g, Rfl: 0 .  meclizine (ANTIVERT) 25 MG tablet, Take 1 tablet (25 mg total) by mouth 2 (two) times daily., Disp: 30 tablet, Rfl: 4  No Known Allergies   Review of Systems Constitutional: -fever, -chills, -sweats, -unexpected weight change, -decreased appetite, -fatigue Allergy: -sneezing, -itching, -congestion Dermatology: -changing moles, --rash, -lumps ENT: -runny nose, -ear pain, -sore throat, -hoarseness, -sinus pain, -teeth pain, - ringing in ears, -hearing loss, -nosebleeds Cardiology: -chest pain, -palpitations, -swelling, -difficulty breathing when lying flat, -waking up short of breath Respiratory: -cough, -shortness of breath, -difficulty breathing with exercise or exertion, -wheezing, -coughing up blood Gastroenterology: -abdominal pain, -nausea, -vomiting, -diarrhea, -constipation,  -blood in stool, -changes in bowel movement, -difficulty swallowing or eating Hematology: -bleeding, -bruising  Musculoskeletal: -joint aches, -muscle aches, -joint swelling, -back pain, -neck pain, -cramping, -changes in gait Ophthalmology: denies vision changes, eye redness, itching, discharge Urology: -burning with urination, -difficulty urinating, -blood in urine, -urinary frequency, -urgency, -incontinence Neurology: -headache, -weakness, -tingling, -numbness, -memory loss, -falls, +occasional dizziness Psychology: -depressed mood, -agitation, -sleep problems, +anxiety     Objective:   Physical Exam  BP 120/72   Pulse 64   Temp 98.4 F (36.9 C) (Oral)   Resp 16   Ht 5\' 8"  (1.727 m)   Wt 139 lb 12.8 oz (63.4 kg)   SpO2 98%   BMI 21.26 kg/m   General appearance: alert, no distress, WD/WN, white male Skin: right neck with 5 mm diameter pearly raised papular lesions, somewhat depressed center, possible basal cell lesion, scattered freckles throughout, left upper cheek with brown 3mm slight raised lesion unchanged per patient for years,  no other worrisome lesion HEENT: normocephalic, conjunctiva/corneas normal, sclerae anicteric, PERRLA, EOMi, nares patent, no discharge or erythema, pharynx normal Oral cavity: MMM, tongue normal, teeth in good repair Neck: supple, no lymphadenopathy, no thyromegaly, no masses, normal ROM, no bruits Chest: non tender, normal shape and expansion Heart: RRR, normal S1, S2, no murmurs Lungs: CTA bilaterally, no wheezes, rhonchi, or rales Abdomen: +bs, soft, non tender, non distended, no masses, no hepatomegaly, no splenomegaly, no bruits Back: non tender, normal ROM, no scoliosis Musculoskeletal: upper extremities non tender, no obvious deformity, normal ROM throughout, lower extremities non tender, no obvious deformity, normal ROM throughout Extremities: no edema, no cyanosis, no clubbing Pulses: 2+ symmetric, upper and lower extremities, normal cap  refill Neurological: alert, oriented x 3, CN2-12 intact, strength normal upper extremities and lower extremities, sensation normal throughout, DTRs 2+ throughout, no cerebellar signs, gait normal Psychiatric: normal affect, behavior normal, pleasant  GU: normal male external genitalia, circumcised, non tender, no masses, no hernia, no lymphadenopathy Rectal: deferred   Assessment and Plan :    Encounter Diagnoses  Name Primary?  . Routine general medical examination at a health care facility Yes  . Generalized anxiety disorder   . Vaccine counseling   . Smoker   . Skin lesion   . Dizziness      Physical exam - discussed healthy lifestyle, diet, exercise, preventative care, vaccinations, and addressed their concerns.    Recommendations:  STOP smoking! See your eye doctor yearly for routine vision care.  See your dentist yearly for routine dental care including hygiene visits twice yearly.  Do a monthly self testicular exam  Return  at your convenience for Td tetanus booster and flu vaccine  advised referral to dermatology for neck skin lesion.  He declines but will consider  Counseled on coping skills for anxiety.  He is doing much better than in past.  C/t xanax prn, self care  Refilled meclizine for rare episode of dizziness    Kaidence was seen today for cpe.  Diagnoses and all orders for this visit:  Routine general medical examination at a health care facility -     POCT Urinalysis DIP (Proadvantage Device) -     Comprehensive metabolic panel -     Lipid panel -     CBC  Generalized anxiety disorder  Vaccine counseling  Smoker  Skin lesion  Dizziness  Other orders -     meclizine (ANTIVERT) 25 MG tablet; Take 1 tablet (25 mg total) by mouth 2 (two) times daily. -     ALPRAZolam (XANAX) 0.25 MG tablet; TAKE 1 TABLET BY MOUTH TWICE A DAY AS NEEDED FOR ANXIETY

## 2018-03-23 LAB — CBC
HEMATOCRIT: 48.8 % (ref 37.5–51.0)
HEMOGLOBIN: 16.6 g/dL (ref 13.0–17.7)
MCH: 31.6 pg (ref 26.6–33.0)
MCHC: 34 g/dL (ref 31.5–35.7)
MCV: 93 fL (ref 79–97)
Platelets: 255 10*3/uL (ref 150–450)
RBC: 5.26 x10E6/uL (ref 4.14–5.80)
RDW: 13 % (ref 12.3–15.4)
WBC: 6.5 10*3/uL (ref 3.4–10.8)

## 2018-03-23 LAB — COMPREHENSIVE METABOLIC PANEL
ALBUMIN: 4.9 g/dL (ref 3.5–5.5)
ALK PHOS: 84 IU/L (ref 39–117)
ALT: 20 IU/L (ref 0–44)
AST: 16 IU/L (ref 0–40)
Albumin/Globulin Ratio: 2 (ref 1.2–2.2)
BILIRUBIN TOTAL: 0.8 mg/dL (ref 0.0–1.2)
BUN / CREAT RATIO: 10 (ref 9–20)
BUN: 8 mg/dL (ref 6–24)
CHLORIDE: 101 mmol/L (ref 96–106)
CO2: 25 mmol/L (ref 20–29)
Calcium: 10.1 mg/dL (ref 8.7–10.2)
Creatinine, Ser: 0.82 mg/dL (ref 0.76–1.27)
GFR calc Af Amer: 125 mL/min/{1.73_m2} (ref >59)
GFR calc non Af Amer: 108 mL/min/{1.73_m2} (ref >59)
GLOBULIN, TOTAL: 2.4 g/dL (ref 1.5–4.5)
Glucose: 82 mg/dL (ref 65–99)
POTASSIUM: 4.8 mmol/L (ref 3.5–5.2)
SODIUM: 141 mmol/L (ref 134–144)
Total Protein: 7.3 g/dL (ref 6.0–8.5)

## 2018-03-23 LAB — LIPID PANEL
CHOLESTEROL TOTAL: 202 mg/dL — AB (ref 100–199)
Chol/HDL Ratio: 3.7 ratio (ref 0.0–5.0)
HDL: 55 mg/dL (ref >39)
LDL Calculated: 123 mg/dL — ABNORMAL HIGH (ref 0–99)
Triglycerides: 120 mg/dL (ref 0–149)
VLDL Cholesterol Cal: 24 mg/dL (ref 5–40)

## 2018-03-25 ENCOUNTER — Telehealth: Payer: Self-pay

## 2018-03-25 NOTE — Telephone Encounter (Signed)
Patient notified of lab results

## 2018-08-26 ENCOUNTER — Other Ambulatory Visit: Payer: Self-pay | Admitting: Medical

## 2018-08-26 NOTE — Telephone Encounter (Signed)
Is this okay to refill? 

## 2018-09-04 ENCOUNTER — Encounter: Payer: Self-pay | Admitting: Medical

## 2018-09-04 ENCOUNTER — Ambulatory Visit: Payer: BLUE CROSS/BLUE SHIELD | Admitting: Medical

## 2018-09-04 ENCOUNTER — Other Ambulatory Visit: Payer: Self-pay

## 2018-09-04 VITALS — Ht 68.0 in | Wt 145.0 lb

## 2018-09-04 DIAGNOSIS — J988 Other specified respiratory disorders: Secondary | ICD-10-CM | POA: Diagnosis not present

## 2018-09-04 NOTE — Progress Notes (Signed)
done

## 2018-09-04 NOTE — Progress Notes (Signed)
Subjective:     Patient ID: Vincent RochesterJason Casillas, male   DOB: 1975/09/26, 43 y.o.   MRN: [id number]  This visit type was conducted due to national recommendations for restrictions regarding the COVID-19 Pandemic (e.g. social distancing) in an effort to limit this patient's exposure and mitigate transmission in our community.  This format is felt to be most appropriate for this patient at this time.    Documentation for virtual audio and video telecommunications through Zoom encounter:  The patient was located at home. The provider was located in the office. The patient did consent to this visit and is aware of possible charges through their insurance for this visit.  The other persons participating in this telemedicine service were none. Time spent on call was 15 minutes and in review of previous records >20 minutes total.  This virtual service is not related to other E/M service within previous 7 days.   HPI Chief Complaint  Patient presents with  . cold    sore throat, stuffy nose, dry throat, congestion in chest X 2-3 days    Virtual visit today for cold symptoms.  Started within the last 1/2 days with stuffy nose, dry throat, raspy throat, head congestion, mild cold symptoms but does not feel particularly that bad.  Has occasional cough.  He denies shortness of breath, sneezing, sweats, body aches, chills, fever, diarrhea, no change in sense of smell or taste, no headache.  Using some NyQuil which is helping.  This past weekend for Easter he had family over and 2 people had, colds none severe.  One was his granddaughter.  He needs a work note as they do not want him there with a cough.  He has had no travel, no known COVID-19 exposure.  Even though his family was over this weekend for Easter, he has not been in contact with a lot of people.  His place of employment has been good about separating people for social distancing. No other aggravating or relieving factors. No other complaint.  Past  Medical History:  Diagnosis Date  . Anxiety   . Chronic back pain   . Headache syndrome    related to anxiety  . Wears glasses      Review of Systems As in subjective    Objective:   Physical Exam Due to coronavirus pandemic stay at home measures, patient visit was virtual and they were not examined in person.   General: Well-appearing, no acute distress, no dyspnea     Assessment:     Encounter Diagnosis  Name Primary?  Marland Kitchen. Respiratory tract infection Yes       Plan:     We discussed the limitations of virtual visit.  His current symptoms suggest a mild cold, likely not COVID-19.    I advised him to hydrate well, rest, can continue DayQuil/NyQuil combo.  I will write him out of work to minimize exposures.  We discussed a period of self quarantine just to be safe.  Assuming he is back to normal completely on Monday in 5 days, he can go back to work then unless symptoms change or worsen over the weekend.  I asked him to call back in 2 days on Friday if he has significantly worsening symptoms or symptoms of COVID-19 that we discussed.  He voices understanding and agreement of plan  I advised he stay at home in general to avoid exposures.  Discussed preventative measures if he has to go to the grocery store, discussed using a mask  and keeping special dispensing at work.  Discussed handwashing.   [place] was seen today for cold.  Diagnoses and all orders for this visit:  Respiratory tract infection

## 2018-11-05 ENCOUNTER — Other Ambulatory Visit: Payer: Self-pay | Admitting: Medical

## 2018-11-05 NOTE — Telephone Encounter (Signed)
[facility]  Is requesting to pt xanax . Please advise [facility]

## 2018-12-25 ENCOUNTER — Other Ambulatory Visit: Payer: Self-pay

## 2018-12-25 ENCOUNTER — Telehealth: Payer: Self-pay | Admitting: Medical

## 2018-12-25 ENCOUNTER — Encounter: Payer: Self-pay | Admitting: Medical

## 2018-12-25 ENCOUNTER — Ambulatory Visit: Payer: BLUE CROSS/BLUE SHIELD | Admitting: Medical

## 2018-12-25 VITALS — Wt 145.0 lb

## 2018-12-25 DIAGNOSIS — J069 Acute upper respiratory infection, unspecified: Secondary | ICD-10-CM

## 2018-12-25 DIAGNOSIS — J029 Acute pharyngitis, unspecified: Secondary | ICD-10-CM | POA: Diagnosis not present

## 2018-12-25 DIAGNOSIS — R05 Cough: Secondary | ICD-10-CM | POA: Diagnosis not present

## 2018-12-25 DIAGNOSIS — R059 Cough, unspecified: Secondary | ICD-10-CM

## 2018-12-25 NOTE — Telephone Encounter (Signed)
  Healthcare provider certification form from [employer] received by fax and sent back in folder

## 2018-12-25 NOTE — Telephone Encounter (Signed)
done

## 2018-12-25 NOTE — Progress Notes (Signed)
done

## 2018-12-25 NOTE — Progress Notes (Signed)
Subjective:     Patient ID: Vincent RochesterJason Hiltunen, male   DOB: 1976/02/20, 43 y.o.   MRN: [id number]  This visit type was conducted due to national recommendations for restrictions regarding the COVID-19 Pandemic (e.g. social distancing) in an effort to limit this patient's exposure and mitigate transmission in our community.  this format is felt to be most appropriate for this patient at this time.    Documentation for virtual audio and video telecommunications through Zoom encounter:  The patient was located at home. The provider was located in the office. The patient did consent to this visit and is aware of possible charges through their insurance for this visit.  The other persons participating in this telemedicine service were none. Time spent on call was 15 minutes and in review of previous records >15 minutes total.  This virtual service is not related to other E/M service within previous 7 days.   HPI Chief Complaint  Patient presents with  . cough    cough, congested, dry throat X 1 week   Virtual consult today for head cold.  Symptoms started about 5 days ago with cough, sore throat, dry raspy throat, some cough in the morning, mucus coming up in the morning.  He denies fever, body aches, chills, sweats, nausea, vomiting, diarrhea, no change in smell or taste, no shortness of breath.  Denies extreme fatigue, using DayQuil and NyQuil but this makes him drowsy so he has cut back on using this.  Denies sick contacts.  No COVID contacts.  He has had ear pain last week but he has been swimming in the pool.  His wife started getting sick yesterday with similar symptoms.  He stayed home for the last 2 days per work protocol on cough and symptoms.  He needs a work note since he still has symptoms.  Overall is getting a little better.  No other aggravating or relieving factors. No other complaint.  Review of Systems As in subjective    Objective:   Physical Exam Due to coronavirus pandemic  stay at home measures, patient visit was virtual and they were not examined in person.   Wt 145 lb (65.8 kg)   BMI 22.05 kg/m       Assessment:     Encounter Diagnoses  Name Primary?  . Cough Yes  . Upper respiratory tract infection, unspecified type   . Sore throat        Plan:     We discussed his symptoms and concerns.  It sounds like his symptoms are mild upper respiratory symptoms and that he is probably on the tail end of symptoms.  I advised he rest, hydrate well, can continue either DayQuil or can use Delsym instead.  Can use Tylenol for pain or aches.  As a precaution continue to self quarantine since he still has cough and I cannot guarantee 100% this is not COVID even though symptoms are mild.  We discussed general precautions and self quarantine measures.  Symptoms of the novel Coronavirus 19 include fever, cough, and shortness of breath.  If worsening symptoms, call our office, call the emergency department, or report to triage at the emergency department.  I will send a work note to his email.  Call or return if worse or not improving.  We discussed timeframe in which to return to work assuming symptoms have   Barbara CowerJason was seen today for cough.  Diagnoses and all orders for this visit:  Cough  Upper respiratory tract infection, unspecified  type  Sore throat

## 2019-01-20 ENCOUNTER — Other Ambulatory Visit: Payer: Self-pay | Admitting: Medical

## 2019-04-07 ENCOUNTER — Other Ambulatory Visit: Payer: Self-pay | Admitting: Medical

## 2019-04-07 NOTE — Telephone Encounter (Signed)
schedule for yearly CPX

## 2019-05-01 ENCOUNTER — Other Ambulatory Visit: Payer: Self-pay | Admitting: Medical

## 2019-05-01 NOTE — Telephone Encounter (Signed)
Is this okay to refill? 

## 2019-05-02 NOTE — Telephone Encounter (Signed)
Due for yearly physical , please schedule

## 2019-05-02 NOTE — Telephone Encounter (Signed)
Pt already scheduled for 12-24

## 2019-05-15 ENCOUNTER — Ambulatory Visit (INDEPENDENT_AMBULATORY_CARE_PROVIDER_SITE_OTHER): Payer: BC Managed Care – PPO | Admitting: Medical

## 2019-05-15 ENCOUNTER — Encounter: Payer: Self-pay | Admitting: Medical

## 2019-05-15 ENCOUNTER — Other Ambulatory Visit: Payer: Self-pay

## 2019-05-15 VITALS — BP 110/80 | HR 79 | Temp 97.6°F | Ht 68.0 in | Wt 147.6 lb

## 2019-05-15 DIAGNOSIS — Z7189 Other specified counseling: Secondary | ICD-10-CM | POA: Diagnosis not present

## 2019-05-15 DIAGNOSIS — Z23 Encounter for immunization: Secondary | ICD-10-CM | POA: Diagnosis not present

## 2019-05-15 DIAGNOSIS — F411 Generalized anxiety disorder: Secondary | ICD-10-CM

## 2019-05-15 DIAGNOSIS — Z0000 Encounter for general adult medical examination without abnormal findings: Secondary | ICD-10-CM

## 2019-05-15 DIAGNOSIS — M25511 Pain in right shoulder: Secondary | ICD-10-CM | POA: Insufficient documentation

## 2019-05-15 DIAGNOSIS — F40248 Other situational type phobia: Secondary | ICD-10-CM

## 2019-05-15 DIAGNOSIS — F17200 Nicotine dependence, unspecified, uncomplicated: Secondary | ICD-10-CM

## 2019-05-15 DIAGNOSIS — Z7185 Encounter for immunization safety counseling: Secondary | ICD-10-CM

## 2019-05-15 DIAGNOSIS — G44209 Tension-type headache, unspecified, not intractable: Secondary | ICD-10-CM

## 2019-05-15 DIAGNOSIS — L989 Disorder of the skin and subcutaneous tissue, unspecified: Secondary | ICD-10-CM

## 2019-05-15 NOTE — Progress Notes (Signed)
Subjective:   HPI  Vincent RochesterJason Walter is a 43 y.o. male who presents for a complete physical.  Concerns: Doing well over all  Still uses xanax for anxiety feelings, but has done much better in recent months.   He and family are living in house with 8 total people including mother in law.  They are in final stages of being ready [place]  Excited about this.  No new c/o.  Wants refill on meclizine for dizziness he gets occasionally as discussed in past.  Been having right shoulder pain x 2 weeks.  No injury.  Hurts to lie on right side.   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   No paresthesias.   Reviewed their medical, surgical, family, social, medication, and allergy history and updated chart as appropriate.  Past Medical History:  Diagnosis Date  . Anxiety   . Chronic back pain   . Headache syndrome    related to anxiety  . Tobacco abuse   . Wears glasses     Past Surgical History:  Procedure Laterality Date  . MANDIBLE FRACTURE SURGERY      Social History   Socioeconomic History  . Marital status: Single    Spouse name: Not on file  . Number of children: 1  . Years of education: GED  . Highest education level: Not on file  Occupational History  . Occupation: Merchandiser, retailsupervisor  Tobacco Use  . Smoking status: Current Every Day Smoker    Packs/day: 1.00    Years: 27.00    Pack years: 27.00    Types: Cigarettes  . Smokeless tobacco: Current User  Substance and Sexual Activity  . Alcohol use: Yes    Alcohol/week: 7.0 standard drinks    Types: 7 Cans of beer per week    Comment: occasional  . Drug use: No  . Sexual activity: Not on file  Other Topics Concern  . Not on file  Social History Narrative   Married.   Has 5 kids.  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  Walks 3-4 miles daily.  Education: GED   04/2019   Social Determinants of Health   Financial Resource Strain:   . Difficulty of Paying Living Expenses: Not on file   Food Insecurity:   . Worried About Programme researcher, broadcasting/film/videounning Out of Food in the Last Year: Not on file  . Ran Out of Food in the Last Year: Not on file  Transportation Needs:   . Lack of Transportation (Medical): Not on file  . Lack of Transportation (Non-Medical): Not on file  Physical Activity:   . Days of Exercise per Week: Not on file  . Minutes of Exercise per Session: Not on file  Stress:   . Feeling of Stress : Not on file  Social Connections:   . Frequency of Communication with Friends and Family: Not on file  . Frequency of Social Gatherings with Friends and Family: Not on file  . Attends Religious Services: Not on file  . Active Member of Clubs or Organizations: Not on file  . Attends BankerClub or Organization Meetings: Not on file  . Marital Status: Not on file  Intimate Partner Violence:   . Fear of Current or Ex-Partner: Not on file  . Emotionally Abused: Not on file  . Physically Abused: Not on file  . Sexually Abused: Not on file    Family History  Problem Relation Age of Onset  . Alcohol abuse Mother   .  Hypertension Father   . Cancer Father        skin  . Hyperlipidemia Brother   . Hypertension Brother   . Anxiety disorder Daughter   . Cancer Paternal Grandfather   . Heart disease Neg Hx   . Other Neg Hx   . Stroke Neg Hx   . Diabetes Neg Hx      Current Outpatient Medications:  .  ALPRAZolam (XANAX) 0.25 MG tablet, TAKE 1 TABLET BY MOUTH TWICE A DAY AS NEEDED FOR ANXIETY, Disp: 30 tablet, Rfl: 0 .  clotrimazole-betamethasone (LOTRISONE) cream, Apply 1 application topically 2 (two) times daily. (Patient not taking: Reported on 12/25/2018), Disp: 30 g, Rfl: 0 .  meclizine (ANTIVERT) 25 MG tablet, Take 1 tablet (25 mg total) by mouth 2 (two) times daily. (Patient not taking: Reported on 12/25/2018), Disp: 30 tablet, Rfl: 4  No Known Allergies   Review of Systems Constitutional: -fever, -chills, -sweats, -unexpected weight change, -decreased appetite, -fatigue Allergy:  -sneezing, -itching, -congestion Dermatology: -changing moles, --rash, -lumps ENT: -runny nose, -ear pain, -sore throat, -hoarseness, -sinus pain, -teeth pain, - ringing in ears, -hearing loss, -nosebleeds Cardiology: -chest pain, -palpitations, -swelling, -difficulty breathing when lying flat, -waking up short of breath Respiratory: -cough, -shortness of breath, -difficulty breathing with exercise or exertion, -wheezing, -coughing up blood Gastroenterology: -abdominal pain, -nausea, -vomiting, -diarrhea, -constipation, -blood in stool, -changes in bowel movement, -difficulty swallowing or eating Hematology: -bleeding, -bruising  Musculoskeletal: +joint aches, -muscle aches, -joint swelling, -back pain, -neck pain, -cramping, -changes in gait Ophthalmology: denies vision changes, eye redness, itching, discharge Urology: -burning with urination, -difficulty urinating, -blood in urine, -urinary frequency, -urgency, -incontinence Neurology: -headache, -weakness, -tingling, -numbness, -memory loss, -falls, +occasional dizziness Psychology: -depressed mood, -agitation, -sleep problems, +anxiety     Objective:   Physical Exam  BP 110/80   Pulse 79   Temp 97.6 F (36.4 C)   Ht 5\' 8"  (1.727 m)   Wt 147 lb 9.6 oz (67 kg)   SpO2 97%   BMI 22.44 kg/m   General appearance: alert, no distress, WD/WN, white male Skin: right neck with 5 mm diameter pearly raised papular lesions, somewhat depressed center, possible basal cell lesion, scattered freckles throughout, left upper cheek with brown 61mm slight raised lesion unchanged per patient for years,  no other worrisome lesion HEENT: normocephalic, conjunctiva/corneas normal, sclerae anicteric, PERRLA, EOMi, nares patent, no discharge or erythema, pharynx normal Oral cavity: MMM, tongue normal, teeth in good repair Neck: supple, no lymphadenopathy, no thyromegaly, no masses, normal ROM, no bruits Chest: non tender, normal shape and expansion Heart:  RRR, normal S1, S2, no murmurs Lungs: CTA bilaterally, no wheezes, rhonchi, or rales Abdomen: +bs, soft, non tender, non distended, no masses, no hepatomegaly, no splenomegaly, no bruits Back: non tender, normal ROM, no scoliosis Musculoskeletal:right shoulder nontender to palpation, mild pain with abduction and crossover, otherwise normal ROM , no laxity normal exam otherwise, upper extremities non tender, no obvious deformity, normal ROM throughout, lower extremities non tender, no obvious deformity, normal ROM throughout Extremities: no edema, no cyanosis, no clubbing Pulses: 2+ symmetric, upper and lower extremities, normal cap refill Neurological: alert, oriented x 3, CN2-12 intact, strength normal upper extremities and lower extremities, sensation normal throughout, DTRs 2+ throughout, no cerebellar signs, gait normal Psychiatric: normal affect, behavior normal, pleasant  GU: normal male external genitalia, circumcised, non tender, no masses, no hernia, no lymphadenopathy Rectal: deferred   Assessment and Plan :    Encounter Diagnoses  Name Primary?  Marland Kitchen  Routine general medical examination at a health care facility Yes  . Smoker   . Vaccine counseling   . Generalized anxiety disorder   . Tension headache   . Situational phobia   . Need for influenza vaccination   . Acute pain of right shoulder   . Skin lesion      Physical exam - discussed healthy lifestyle, diet, exercise, preventative care, vaccinations, and addressed their concerns.    Recommendations:  STOP smoking! See your eye doctor yearly for routine vision care.  See your dentist yearly for routine dental care including hygiene visits twice yearly.  Do a monthly self testicular exam  Return at your convenience for Td tetanus booster   Declines pneumococcal vaccine  Counseled on the influenza virus vaccine.  Vaccine information sheet given.  Influenza vaccine given after consent obtained.  Refer to dermatology  for neck skin lesion.   Right shoulder sprain/AC sprain - advised 1-2 weeks of relative rest, stretching, OTC ibuprofen BID x 1 week.   F/u if not improving.    Skin lesion - refer to dermatology for eval and possible biopsy    Vincent Walter was seen today for annual exam.  Diagnoses and all orders for this visit:  Routine general medical examination at a health care facility -     Comprehensive metabolic panel -     CBC with Differential -     Lipid Panel -     TSH regular  Smoker  Vaccine counseling  Generalized anxiety disorder  Tension headache  Situational phobia  Need for influenza vaccination  Acute pain of right shoulder  Skin lesion -     Ambulatory referral to Dermatology  Other orders -     Flu Vaccine QUAD 6+ mos PF IM (Fluarix Quad PF)

## 2019-05-16 LAB — COMPREHENSIVE METABOLIC PANEL
ALT: 22 IU/L (ref 0–44)
AST: 27 IU/L (ref 0–40)
Albumin/Globulin Ratio: 2 (ref 1.2–2.2)
Albumin: 4.9 g/dL (ref 4.0–5.0)
Alkaline Phosphatase: 86 IU/L (ref 39–117)
BUN/Creatinine Ratio: 12 (ref 9–20)
BUN: 11 mg/dL (ref 6–24)
Bilirubin Total: 1.1 mg/dL (ref 0.0–1.2)
CO2: 23 mmol/L (ref 20–29)
Calcium: 10 mg/dL (ref 8.7–10.2)
Chloride: 103 mmol/L (ref 96–106)
Creatinine, Ser: 0.92 mg/dL (ref 0.76–1.27)
GFR calc Af Amer: 117 mL/min/{1.73_m2} (ref >59)
GFR calc non Af Amer: 101 mL/min/{1.73_m2} (ref >59)
Globulin, Total: 2.4 g/dL (ref 1.5–4.5)
Glucose: 82 mg/dL (ref 65–99)
Potassium: 4.3 mmol/L (ref 3.5–5.2)
Sodium: 139 mmol/L (ref 134–144)
Total Protein: 7.3 g/dL (ref 6.0–8.5)

## 2019-05-16 LAB — CBC WITH DIFFERENTIAL/PLATELET
Basophils Absolute: 0.1 10*3/uL (ref 0.0–0.2)
Basos: 1 %
EOS (ABSOLUTE): 0.2 10*3/uL (ref 0.0–0.4)
Eos: 2 %
Hematocrit: 49.6 % (ref 37.5–51.0)
Hemoglobin: 17 g/dL (ref 13.0–17.7)
Immature Grans (Abs): 0 10*3/uL (ref 0.0–0.1)
Immature Granulocytes: 0 %
Lymphocytes Absolute: 1.9 10*3/uL (ref 0.7–3.1)
Lymphs: 26 %
MCH: 32.8 pg (ref 26.6–33.0)
MCHC: 34.3 g/dL (ref 31.5–35.7)
MCV: 96 fL (ref 79–97)
Monocytes Absolute: 0.8 10*3/uL (ref 0.1–0.9)
Monocytes: 11 %
Neutrophils Absolute: 4.3 10*3/uL (ref 1.4–7.0)
Neutrophils: 60 %
Platelets: 256 10*3/uL (ref 150–450)
RBC: 5.19 x10E6/uL (ref 4.14–5.80)
RDW: 12.8 % (ref 11.6–15.4)
WBC: 7.2 10*3/uL (ref 3.4–10.8)

## 2019-05-16 LAB — TSH: TSH: 1.63 u[IU]/mL (ref 0.450–4.500)

## 2019-05-16 LAB — LIPID PANEL
Chol/HDL Ratio: 3.5 ratio (ref 0.0–5.0)
Cholesterol, Total: 191 mg/dL (ref 100–199)
HDL: 54 mg/dL (ref >39)
LDL Chol Calc (NIH): 116 mg/dL — ABNORMAL HIGH (ref 0–99)
Triglycerides: 117 mg/dL (ref 0–149)
VLDL Cholesterol Cal: 21 mg/dL (ref 5–40)

## 2019-05-19 ENCOUNTER — Telehealth: Payer: Self-pay | Admitting: Medical

## 2019-05-19 NOTE — Telephone Encounter (Signed)
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 Pt is also going to get a test, pt can be reached at [phone]

## 2019-05-20 ENCOUNTER — Encounter: Payer: Self-pay | Admitting: Medical

## 2019-05-20 ENCOUNTER — Other Ambulatory Visit: Payer: Self-pay | Admitting: Medical

## 2019-05-20 ENCOUNTER — Ambulatory Visit: Payer: BC Managed Care – PPO | Attending: Internal Medicine

## 2019-05-20 ENCOUNTER — Other Ambulatory Visit: Payer: Self-pay

## 2019-05-20 DIAGNOSIS — Z20828 Contact with and (suspected) exposure to other viral communicable diseases: Secondary | ICD-10-CM | POA: Diagnosis not present

## 2019-05-20 DIAGNOSIS — Z20822 Contact with and (suspected) exposure to covid-19: Secondary | ICD-10-CM

## 2019-05-20 NOTE — Telephone Encounter (Signed)
I did see in epic that pt went today and got tested, and Is waiting on results

## 2019-05-20 NOTE — Telephone Encounter (Signed)
Pt called states he needs to the Note to say that he was exposed on Dec the 26th and that he got tested, pt went to [facility] a [facility] this morning to get a test, and they informed him that the results would be back in 3 to 5 days

## 2019-05-20 NOTE — Telephone Encounter (Signed)
If we have copies of any of the labs or you can see where he had labs done computer then we can write a note.  If not write a note basically stating that patient notes recent exposure and was advised to quarantine for 10 to 14 days to watch for any symptom development.  However if no symptoms and if he gets a negative Covid test done on day 5 post exposure then he can go back to work sooner

## 2019-05-21 NOTE — Telephone Encounter (Signed)
Done

## 2019-05-21 NOTE — Telephone Encounter (Signed)
Ok so we can write a tentative note that he is under quarantine due to exposure, is awaiting testing, and write tentatively out for 7 days from date of exposure for now.

## 2019-05-22 LAB — NOVEL CORONAVIRUS, NAA: SARS-CoV-2, NAA: NOT DETECTED

## 2019-06-20 ENCOUNTER — Telehealth: Payer: Self-pay | Admitting: Medical

## 2019-06-20 ENCOUNTER — Other Ambulatory Visit: Payer: Self-pay | Admitting: Medical

## 2019-06-20 DIAGNOSIS — M25511 Pain in right shoulder: Secondary | ICD-10-CM

## 2019-06-20 NOTE — Telephone Encounter (Signed)
Please go to [facility] for your right shoulder xray.   Their hours are 8am - 4:30 pm Monday - Friday.  Take your insurance card with you.  [facility] [phone]  [address], [address] [place], [place] [address]  [address] [place], [place] [address]

## 2019-06-20 NOTE — Telephone Encounter (Signed)
Pt called and said he needs a referral to get an x ray on his shoulder

## 2019-06-20 NOTE — Telephone Encounter (Signed)
Message sent to patient via mychart

## 2019-06-27 ENCOUNTER — Ambulatory Visit
Admission: RE | Admit: 2019-06-27 | Discharge: 2019-06-27 | Disposition: A | Discharge status: Home / Self Care | Payer: BC Managed Care – PPO | Source: Ambulatory Visit | Attending: Medical | Admitting: Medical

## 2019-06-27 ENCOUNTER — Other Ambulatory Visit: Payer: Self-pay

## 2019-06-27 DIAGNOSIS — M25511 Pain in right shoulder: Secondary | ICD-10-CM

## 2019-06-27 DIAGNOSIS — M19011 Primary osteoarthritis, right shoulder: Secondary | ICD-10-CM | POA: Diagnosis not present

## 2019-07-14 ENCOUNTER — Other Ambulatory Visit: Payer: Self-pay

## 2019-07-14 ENCOUNTER — Ambulatory Visit: Payer: BC Managed Care – PPO | Admitting: Orthopedic Surgery

## 2019-07-14 DIAGNOSIS — M25511 Pain in right shoulder: Secondary | ICD-10-CM | POA: Diagnosis not present

## 2019-07-14 DIAGNOSIS — C4441 Basal cell carcinoma of skin of scalp and neck: Secondary | ICD-10-CM | POA: Diagnosis not present

## 2019-07-15 ENCOUNTER — Encounter: Payer: Self-pay | Admitting: Orthopedic Surgery

## 2019-07-15 NOTE — Progress Notes (Signed)
Office Visit Note   Patient: Vincent Walter           Date of Birth: [birth year]           MRN: [id number] Visit Date: 07/14/2019 Requested by: Jac Canavan, PA-C [address] [place],  [place] [address] PCP: Jac Canavan, PA-C  Subjective: Chief Complaint  Patient presents with  . Right Shoulder - Pain    HPI: Vincent Walter is a patient with right shoulder pain.  Is been going on for 2 months with no known injury.  The pain does wake him from sleep at night 5 out of 7 nights.  He is right-hand dominant.  Describes popping and catching and a feeling like his arm is getting stuck.  Has taken some over-the-counter medication without much relief.  He works as a Building surveyor.  The pain does radiate down the arm but not below the elbow.  Hard for him to get comfortable.  Occasionally the shoulder will pop and it will radiate pain down the back of his arm.  Denies much in the way of neck pain.  Hard for him to lay on the right-hand side.              ROS: All systems reviewed are negative as they relate to the chief complaint within the history of present illness.  Patient denies  fevers or chills.   Assessment & Plan: Visit Diagnoses:  1. Right shoulder pain, unspecified chronicity     Plan: Impression is right shoulder pain unclear etiology with good rotator cuff strength and normal radiographs.  I think he may have a SLAP tear which is the most likely explanation.  No arthritis on plain radiographs and no AC joint tenderness on exam today.  We will need to revisit his exam but for now with 2 months of symptoms night pain and good rotator cuff strength and daily symptoms we need to get MRI arthrogram of the right shoulder to evaluate for SLAP tear.  We will see him back after that study  Follow-Up Instructions: Return for after MRI.   Orders:  Orders Placed This Encounter  Procedures  . MR SHOULDER RIGHT W CONTRAST  . Arthrogram   No orders of the defined types  were placed in this encounter.     Procedures: No procedures performed   Clinical Data: No additional findings.  Objective: Vital Signs: There were no vitals taken for this visit.  Physical Exam:   Constitutional: Patient appears well-developed HEENT:  Head: Normocephalic Eyes:EOM are normal Neck: Normal range of motion Cardiovascular: Normal rate Pulmonary/chest: Effort normal Neurologic: Patient is alert Skin: Skin is warm Psychiatric: Patient has normal mood and affect    Ortho Exam: Ortho exam demonstrates full active and passive range of motion of the right shoulder.  O'Brien's testing positive on the right negative on the left.  Negative apprehension anterior and posterior.  No discrete AC joint tenderness right versus left no pain with crossarm a deduction.  Not much in the way of coarse grinding or crepitus except with 90 degrees of abduction and loading.  I detect a little bit but it is not as much as I would expect for the amount of symptoms he is having.  No other masses lymphadenopathy or skin changes noted in that right shoulder region  Specialty Comments:  No specialty comments available.  Imaging: No results found.   PMFS History: Patient Active Problem List   Diagnosis Date Noted  . Need  for influenza vaccination 05/15/2019  . Acute pain of right shoulder 05/15/2019  . Skin lesion 03/22/2018  . Situational phobia 11/29/2016  . Tension headache 07/05/2016  . Routine general medical examination at a health care facility 03/29/2016  . Smoker 03/29/2016  . Vaccine counseling 03/29/2016  . Generalized anxiety disorder 03/29/2016   Past Medical History:  Diagnosis Date  . Anxiety   . Chronic back pain   . Headache syndrome    related to anxiety  . Tobacco abuse   . Wears glasses     Family History  Problem Relation Age of Onset  . Alcohol abuse Mother   . Hypertension Father   . Cancer Father        skin  . Hyperlipidemia Brother   .  Hypertension Brother   . Anxiety disorder Daughter   . Cancer Paternal Grandfather   . Heart disease Neg Hx   . Other Neg Hx   . Stroke Neg Hx   . Diabetes Neg Hx     Past Surgical History:  Procedure Laterality Date  . MANDIBLE FRACTURE SURGERY     Social History   Occupational History  . Occupation: Librarian, academic  Tobacco Use  . Smoking status: Current Every Day Smoker    Packs/day: 1.00    Years: 27.00    Pack years: 27.00    Types: Cigarettes  . Smokeless tobacco: Current User  Substance and Sexual Activity  . Alcohol use: Yes    Alcohol/week: 7.0 standard drinks    Types: 7 Cans of beer per week    Comment: occasional  . Drug use: No  . Sexual activity: Not on file

## 2019-07-30 ENCOUNTER — Telehealth: Payer: Self-pay | Admitting: Orthopedic Surgery

## 2019-07-30 NOTE — Telephone Encounter (Signed)
Called patient left voicemail message to return call to schedule an appointment with Dr dean for MRI review   MRI scheduled 08/19/2019

## 2019-08-15 ENCOUNTER — Other Ambulatory Visit: Payer: Self-pay | Admitting: Medical

## 2019-08-19 ENCOUNTER — Ambulatory Visit
Admission: RE | Admit: 2019-08-19 | Discharge: 2019-08-19 | Disposition: A | Discharge status: Home / Self Care | Payer: BC Managed Care – PPO | Source: Ambulatory Visit | Attending: Orthopedic Surgery | Admitting: Orthopedic Surgery

## 2019-08-19 ENCOUNTER — Other Ambulatory Visit: Payer: Self-pay

## 2019-08-19 DIAGNOSIS — M25511 Pain in right shoulder: Secondary | ICD-10-CM

## 2019-08-19 DIAGNOSIS — M25611 Stiffness of right shoulder, not elsewhere classified: Secondary | ICD-10-CM | POA: Diagnosis not present

## 2019-08-19 MED ORDER — IOPAMIDOL (ISOVUE-M 200) INJECTION 41%
20.0000 mL | Freq: Once | INTRAMUSCULAR | Status: AC
  Administered 2019-08-19: 20 mL via INTRA_ARTICULAR

## 2019-08-21 ENCOUNTER — Telehealth: Payer: Self-pay | Admitting: Orthopedic Surgery

## 2019-08-21 NOTE — Telephone Encounter (Signed)
IC s/w and he would like for you to call him to discuss MRI results. He said that he did some research and he feels like he will need surgery but would like to discuss further with you.

## 2019-08-21 NOTE — Telephone Encounter (Signed)
Pt called in asking to speak with a nurse for Dr. August Saucer, I offered to take a message and the pt stated he just had a quick question regarding his MRI.  [phone]

## 2019-08-21 NOTE — Telephone Encounter (Signed)
Hi Lauren.  I talked to [place].  Discussed with him the risk and benefits of surgery and nonsurgical options.  This is something that he really does not want to live with.  He is having a fair amount of mechanical symptoms in the shoulder.  Anterior inferior posterior inferior glenohumeral ligaments intact rotator cuff intact.  He would do well with arthroscopy limited debridement and biceps tenodesis.  I have talked to [place] and she is going to schedule that.  Thanks the risk and benefits of the procedure are discussed with the patient including not limited to infection nerve vessel damage shoulder stiffness and he would need to be out of lifting work for about 6 weeks.  All his questions were answered.  Thanks

## 2019-09-15 ENCOUNTER — Encounter: Payer: Self-pay | Admitting: Orthopedic Surgery

## 2019-09-15 ENCOUNTER — Other Ambulatory Visit: Payer: Self-pay | Admitting: Surgical

## 2019-09-15 DIAGNOSIS — M7501 Adhesive capsulitis of right shoulder: Secondary | ICD-10-CM | POA: Diagnosis not present

## 2019-09-15 DIAGNOSIS — M659 Synovitis and tenosynovitis, unspecified: Secondary | ICD-10-CM | POA: Diagnosis not present

## 2019-09-15 DIAGNOSIS — G8918 Other acute postprocedural pain: Secondary | ICD-10-CM | POA: Diagnosis not present

## 2019-09-15 DIAGNOSIS — S43431A Superior glenoid labrum lesion of right shoulder, initial encounter: Secondary | ICD-10-CM | POA: Diagnosis not present

## 2019-09-15 MED ORDER — ASPIRIN EC 81 MG PO TBEC: 81.0000 mg | DELAYED_RELEASE_TABLET | Freq: Every day | ORAL | 0 refills | Status: DC

## 2019-09-15 MED ORDER — OXYCODONE HCL 5 MG PO TABS: 5.0000 mg | ORAL_TABLET | ORAL | 0 refills | Status: DC | PRN

## 2019-09-15 MED ORDER — METHOCARBAMOL 500 MG PO TABS: 500.0000 mg | ORAL_TABLET | Freq: Three times a day (TID) | ORAL | 0 refills | Status: DC | PRN

## 2019-09-16 DIAGNOSIS — S43411A Sprain of right coracohumeral (ligament), initial encounter: Secondary | ICD-10-CM | POA: Diagnosis not present

## 2019-09-24 ENCOUNTER — Ambulatory Visit (INDEPENDENT_AMBULATORY_CARE_PROVIDER_SITE_OTHER): Payer: BC Managed Care – PPO | Admitting: Orthopedic Surgery

## 2019-09-24 ENCOUNTER — Other Ambulatory Visit: Payer: Self-pay

## 2019-09-24 ENCOUNTER — Encounter: Payer: Self-pay | Admitting: Orthopedic Surgery

## 2019-09-24 ENCOUNTER — Other Ambulatory Visit: Payer: Self-pay | Admitting: Medical

## 2019-09-24 DIAGNOSIS — M67813 Other specified disorders of tendon, right shoulder: Secondary | ICD-10-CM

## 2019-09-24 MED ORDER — OXYCODONE HCL 5 MG PO TABS: 5.0000 mg | ORAL_TABLET | Freq: Two times a day (BID) | ORAL | 0 refills | Status: DC | PRN

## 2019-09-24 MED ORDER — NAPROXEN 500 MG PO TABS: 500.0000 mg | ORAL_TABLET | Freq: Two times a day (BID) | ORAL | 0 refills | Status: DC

## 2019-09-24 NOTE — Progress Notes (Signed)
Post-Op Visit Note   Patient: Vincent Walter           Date of Birth: [birth year]           MRN: [id number] Visit Date: 09/24/2019 PCP: Jac Canavan, PA-C   Assessment & Plan:  Chief Complaint:  Chief Complaint  Patient presents with  . Right Shoulder - Routine Post Op   Visit Diagnoses:  1. Biceps tendonosis of right shoulder     Plan: Patient is a 44 year old male who presents s/p biceps tenodesis with shoulder manipulation under anesthesia on 09/15/2019.  He is about 9 days out from surgery.  He is doing well overall and has been sleeping well in a recliner.  He is taking oxycodone about twice a day for pain control as well as using a muscle relaxer prior to bed.  Sutures remain intact and were removed today and replaced with Steri-Strips.  Incision seem to be healing very well.  He was found to have an early frozen shoulder at time of surgery with likely onset between the last office visit and the day of surgery.  Shoulder was manipulated while patient was under anesthesia.  On exam patient has about 70 degrees of abduction and 80 degrees of forward flexion.  He is stiff with external rotation as well.  Due to this, we will start Naprosyn as well as refer patient to physical therapy to work on passive range of motion and active assisted range of motion.  He will not work on strengthening just yet.  He may discontinue the sling and hold off on lifting anything without arm.  Continue out of work status for 3 more weeks.  Follow-up in 3 weeks for clinical recheck and we will determine return to work at that time.  Follow-Up Instructions: No follow-ups on file.   Orders:  No orders of the defined types were placed in this encounter.  Meds ordered this encounter  Medications  . naproxen (NAPROSYN) 500 MG tablet    Sig: Take 1 tablet (500 mg total) by mouth 2 (two) times daily with a meal.    Dispense:  60 tablet    Refill:  0    Imaging: No results found.  PMFS  History: Patient Active Problem List   Diagnosis Date Noted  . Need for influenza vaccination 05/15/2019  . Acute pain of right shoulder 05/15/2019  . Skin lesion 03/22/2018  . Situational phobia 11/29/2016  . Tension headache 07/05/2016  . Routine general medical examination at a health care facility 03/29/2016  . Smoker 03/29/2016  . Vaccine counseling 03/29/2016  . Generalized anxiety disorder 03/29/2016   Past Medical History:  Diagnosis Date  . Anxiety   . Chronic back pain   . Headache syndrome    related to anxiety  . Tobacco abuse   . Wears glasses     Family History  Problem Relation Age of Onset  . Alcohol abuse Mother   . Hypertension Father   . Cancer Father        skin  . Hyperlipidemia Brother   . Hypertension Brother   . Anxiety disorder Daughter   . Cancer Paternal Grandfather   . Heart disease Neg Hx   . Other Neg Hx   . Stroke Neg Hx   . Diabetes Neg Hx     Past Surgical History:  Procedure Laterality Date  . MANDIBLE FRACTURE SURGERY     Social History   Occupational History  . Occupation: Merchandiser, retail  Tobacco  Use  . Smoking status: Current Every Day Smoker    Packs/day: 1.00    Years: 27.00    Pack years: 27.00    Types: Cigarettes  . Smokeless tobacco: Current User  Substance and Sexual Activity  . Alcohol use: Yes    Alcohol/week: 7.0 standard drinks    Types: 7 Cans of beer per week    Comment: occasional  . Drug use: No  . Sexual activity: Not on file

## 2019-09-29 ENCOUNTER — Telehealth: Payer: Self-pay | Admitting: Orthopedic Surgery

## 2019-09-29 ENCOUNTER — Other Ambulatory Visit: Payer: Self-pay

## 2019-09-29 DIAGNOSIS — M67813 Other specified disorders of tendon, right shoulder: Secondary | ICD-10-CM

## 2019-09-29 NOTE — Telephone Encounter (Signed)
Vincent Walter with therapy called stating she needed Dr. August Saucer to change the order from PT to occupational therapy so that she can get an appt set up for him. Vincent Walter said that once it's been changed, the order can be faxed over.   Vincent Walter # [phone] Fax# [phone]

## 2019-09-29 NOTE — Telephone Encounter (Signed)
Put in new order for occ therapy. Tried calling the [facility] facility to advise.

## 2019-10-01 ENCOUNTER — Encounter (HOSPITAL_COMMUNITY): Payer: Self-pay | Admitting: Occupational Therapy

## 2019-10-01 ENCOUNTER — Other Ambulatory Visit: Payer: Self-pay

## 2019-10-01 ENCOUNTER — Ambulatory Visit (HOSPITAL_COMMUNITY): Payer: BC Managed Care – PPO | Attending: Surgical | Admitting: Occupational Therapy

## 2019-10-01 DIAGNOSIS — M25511 Pain in right shoulder: Secondary | ICD-10-CM | POA: Diagnosis not present

## 2019-10-01 DIAGNOSIS — R29898 Other symptoms and signs involving the musculoskeletal system: Secondary | ICD-10-CM

## 2019-10-01 DIAGNOSIS — M25611 Stiffness of right shoulder, not elsewhere classified: Secondary | ICD-10-CM | POA: Diagnosis not present

## 2019-10-01 NOTE — Patient Instructions (Signed)
1) SHOULDER: Flexion On Table   Place hands on towel placed on table, elbows straight. Lean forward with you upper body, pushing towel away from body.  _15__ reps per set, _3__ sets per day  2) Abduction (Passive)   With arm out to side, resting on towel placed on table with palm DOWN, keeping trunk away from table, lean to the side while pushing towel away from body.  Repeat __15__ times. Do __3__ sessions per day.  Copyright  VHI. All rights reserved.     3) Internal Rotation (Assistive)   Seated with elbow bent at right angle and held against side, slide arm on table surface in an inward arc keeping elbow anchored in place. Repeat _15___ times. Do __3__ sessions per day. Activity: Use this motion to brush crumbs off the table.  Copyright  VHI. All rights reserved.    

## 2019-10-01 NOTE — Therapy (Signed)
[facility] [address] [place], [place], [address] Phone: [phone]   Fax:  [phone]  Occupational Therapy Evaluation  Patient Details  Name: Vincent Walter MRN: [id number] Date of Birth: 1976-02-20 Referring Provider (OT): Harriette Bouillon, [place]   Encounter Date: 10/01/2019  OT End of Session - 10/01/19 1109    Visit Number  1    Number of Visits  16    Date for OT Re-Evaluation  11/30/19   mini reassessment 10/30/19   Authorization Type  BCBS    OT Start Time  1031    OT Stop Time  1101    OT Time Calculation (min)  30 min    Activity Tolerance  Patient tolerated treatment well    Behavior During Therapy  [facility] for tasks assessed/performed       Past Medical History:  Diagnosis Date  . Anxiety   . Chronic back pain   . Headache syndrome    related to anxiety  . Tobacco abuse   . Wears glasses     Past Surgical History:  Procedure Laterality Date  . MANDIBLE FRACTURE SURGERY      There were no vitals filed for this visit.  Subjective Assessment - 10/01/19 1056    Subjective   S: It's been pretty sore.    Pertinent History  Pt is a 44 y/o male s/p bicep tenodesis on 09/15/19 with additional findings of frozen shoulder. Pt presents without sling, removed last week per MD orders. Pt was referred to occupational therapy for evaluation and treatment by Harriette Bouillon, PA-C; surgeon was Dr. Cammy Copa.    Special Tests  FOTO: 32/100    Patient Stated Goals  To be able to use my arm and have less pain.    Currently in Pain?  Yes    Pain Score  4     Pain Location  Shoulder    Pain Orientation  Right    Pain Descriptors / Indicators  Aching;Sore    Pain Type  Acute pain    Pain Radiating Towards  elbow    Pain Onset  More than a month ago    Pain Frequency  Intermittent    Aggravating Factors   movement, reaching    Pain Relieving Factors  rest    Effect of Pain on Daily Activities  max effect on ADLs    Multiple Pain Sites  No        [facility] OT Assessment - 10/01/19 1031      Assessment   Medical Diagnosis  s/p right bicep tenodesis    Referring Provider (OT)  Harriette Bouillon, PA-C    Onset Date/Surgical Date  09/15/19    Hand Dominance  Right    Next MD Visit  10/15/2019    Prior Therapy  None      Precautions   Precautions  Shoulder    Type of Shoulder Precautions  P/ROM, AA/ROM; no strengthening until approved by MD      Balance Screen   Has the patient fallen in the past 6 months  No    Has the patient had a decrease in activity level because of a fear of falling?   No    Is the patient reluctant to leave their home because of a fear of falling?   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       Written Expression   Dominant Hand  Right      Cognition   Overall Cognitive Status  Within Functional Limits for tasks assessed      Observation/Other Assessments   Focus on Therapeutic Outcomes (FOTO)   32/100      ROM / Strength   AROM / PROM / Strength  AROM;PROM;Strength      Palpation   Palpation comment  Min fascial restrictions in upper arm, trapezius, scapular regions      AROM   Overall AROM Comments  Assessed seated, er/IR adducted    AROM Assessment Site  Shoulder    Right/Left Shoulder  Right    Right Shoulder Flexion  57 Degrees    Right Shoulder ABduction  62 Degrees    Right Shoulder Internal Rotation  90 Degrees    Right Shoulder External Rotation  26 Degrees      PROM   Overall PROM Comments  Assessed supine, er/IR adducted    PROM Assessment Site  Shoulder    Right/Left Shoulder  Right    Right Shoulder Flexion  97 Degrees    Right Shoulder ABduction  75  Degrees    Right Shoulder Internal Rotation  90 Degrees    Right Shoulder External Rotation  12 Degrees      Strength   Overall Strength Comments  Assessed seated, er/IR adducted-observation only, no formal MMT due to precautions                      OT Education - 10/01/19 1109    Education Details  table slides    Person(s) Educated  Patient    Methods  Explanation;Demonstration;Handout    Comprehension  Verbalized understanding;Returned demonstration       OT Short Term Goals - 10/01/19 1348      OT SHORT TERM GOAL #1   Title  Pt will be provided with and educated on HEP to improve mobility in RUE required for ADL completion.    Time  4    Period  Weeks    Status  New    Target Date  10/31/19      OT SHORT TERM GOAL #2   Title  Pt will increase RUE P/ROM to [facility] to improve ability to perform dressing tasks with minimal compensatory movements.    Time  4    Period  Weeks    Status  New      OT SHORT TERM GOAL #3   Title  Pt will increase RUE strength to 3+/5 to improve ability to perform grooming tasks such as washing face, and eating tasks using dominant RUE.    Time  4    Period  Weeks    Status  New        OT Long Term Goals - 10/01/19 1350      OT LONG TERM GOAL #1   Title  Pt will decrease RUE pain to 3/10 or less to improve ability to sleep in the bed for 4 consecutive hours or greater at night.    Time  8    Period  Weeks    Status  New    Target Date  11/30/19  OT LONG TERM GOAL #2   Title  Pt will decrease fascial restrictions in RUE to trace amounts to improve mobility required for functional reaching tasks.    Time  8    Period  Weeks    Status  New      OT LONG TERM GOAL #3   Title  Pt will increase A/ROM of RUE to [facility] to improve ability to reach overhead and behind back during dressing, bathing, etc.    Time  8    Period  Weeks    Status  New      OT LONG TERM GOAL #4   Title  Pt will increase RUE strength to 4+/5 or more  to improve ability to perform lifting tasks during ADLs and at work as needed.    Time  8    Period  Weeks    Status  New      OT LONG TERM GOAL #5   Title  Pt will return to highest level of functioning during ADLs and work tasks using RUE as dominant.    Time  8    Period  Weeks    Status  New            Plan - 10/01/19 1110    Clinical Impression Statement  A: Pt is a 44 y/o male s/p right bicep tenodesis on 09/15/19, MD also noting frozen shoulder treated with manipulation as well. Pt with RUE functional deficits limiting use of RUE as dominant during ADLs. Pt is using ROM machine at home, is able to get to 84 degrees currently.    OT Occupational Profile and History  Problem Focused Assessment - Including review of records relating to presenting problem    Occupational performance deficits (Please refer to evaluation for details):  ADL's;IADL's;Rest and Sleep;Work;Leisure    Body Structure / Function / Physical Skills  ADL;Endurance;UE functional use;Fascial restriction;Flexibility;Pain;ROM;IADL;Strength    Rehab Potential  Good    Clinical Decision Making  Limited treatment options, no task modification necessary    Comorbidities Affecting Occupational Performance:  None    Modification or Assistance to Complete Evaluation   No modification of tasks or assist necessary to complete eval    OT Frequency  2x / week    OT Duration  8 weeks    OT Treatment/Interventions  Self-care/ADL training;Ultrasound;Patient/family education;Passive range of motion;Cryotherapy;Electrical Stimulation;Moist Heat;Therapeutic exercise;Manual Therapy;Therapeutic activities    Plan  P: pt will benefit from skilled OT services to decrease pain and fascial restrictions, increase ROM, strength, and functional use of RUE. Treatment plan: myofascial release and manual techniques, passive stretching, AA/ROM, A/ROM, general RUE strengthening, scapular mobility and strengthening, modalities prn    OT Home  Exercise Plan  eval: table slides    Consulted and Agree with Plan of Care  Patient       Patient will benefit from skilled therapeutic intervention in order to improve the following deficits and impairments:   Body Structure / Function / Physical Skills: ADL, Endurance, UE functional use, Fascial restriction, Flexibility, Pain, ROM, IADL, Strength       Visit Diagnosis: Acute pain of right shoulder  Stiffness of right shoulder, not elsewhere classified  Other symptoms and signs involving the musculoskeletal system    Problem List Patient Active Problem List   Diagnosis Date Noted  . Need for influenza vaccination 05/15/2019  . Acute pain of right shoulder 05/15/2019  . Skin lesion 03/22/2018  . Situational phobia 11/29/2016  . Tension headache  07/05/2016  . Routine general medical examination at a health care facility 03/29/2016  . Smoker 03/29/2016  . Vaccine counseling 03/29/2016  . Generalized anxiety disorder 03/29/2016   Guadelupe Sabin, OTR/L  [phone] 10/01/2019, 3:37 PM  [facility] [address] [place], [place], [address] Phone: [phone]   Fax:  [phone]  Name: Samarion Ehle MRN: [id number] Date of Birth: 06-14-1975

## 2019-10-03 ENCOUNTER — Other Ambulatory Visit: Payer: Self-pay

## 2019-10-03 ENCOUNTER — Encounter (HOSPITAL_COMMUNITY): Payer: Self-pay | Admitting: Occupational Therapy

## 2019-10-03 ENCOUNTER — Ambulatory Visit (HOSPITAL_COMMUNITY): Payer: BC Managed Care – PPO | Admitting: Occupational Therapy

## 2019-10-03 DIAGNOSIS — M25611 Stiffness of right shoulder, not elsewhere classified: Secondary | ICD-10-CM

## 2019-10-03 DIAGNOSIS — R29898 Other symptoms and signs involving the musculoskeletal system: Secondary | ICD-10-CM

## 2019-10-03 DIAGNOSIS — M25511 Pain in right shoulder: Secondary | ICD-10-CM

## 2019-10-03 NOTE — Therapy (Signed)
[facility] [address] [place], [place], [address] Phone: [phone]   Fax:  [phone]  Occupational Therapy Treatment  Patient Details  Name: Vincent Walter MRN: [id number] Date of Birth: Dec 30, 1975 Referring Provider (OT): Harriette Bouillon, [place]   Encounter Date: 10/03/2019  OT End of Session - 10/03/19 1710    Visit Number  2    Number of Visits  16    Date for OT Re-Evaluation  11/30/19   mini reassessment 10/30/19   Authorization Type  BCBS    OT Start Time  1431    OT Stop Time  1509    OT Time Calculation (min)  38 min    Activity Tolerance  Patient tolerated treatment well    Behavior During Therapy  [facility] for tasks assessed/performed       Past Medical History:  Diagnosis Date  . Anxiety   . Chronic back pain   . Headache syndrome    related to anxiety  . Tobacco abuse   . Wears glasses     Past Surgical History:  Procedure Laterality Date  . MANDIBLE FRACTURE SURGERY      There were no vitals filed for this visit.  Subjective Assessment - 10/03/19 1431    Subjective   S:I've been doing the stretches and I can tell a little difference now.    Currently in Pain?  Yes    Pain Score  3     Pain Location  Shoulder    Pain Orientation  Right    Pain Descriptors / Indicators  Aching;Sore    Pain Type  Acute pain    Pain Radiating Towards  elbow    Pain Onset  More than a month ago    Pain Frequency  Intermittent    Aggravating Factors   movement, reaching    Pain Relieving Factors  rest    Effect of Pain on Daily Activities  max effect on ADLs    Multiple Pain Sites  No         [facility] OT Assessment - 10/03/19 1430      Assessment   Medical Diagnosis  s/p right bicep tenodesis      Precautions   Precautions  Shoulder    Type of Shoulder Precautions  P/ROM, AA/ROM; no strengthening until approved by MD               OT Treatments/Exercises (OP) - 10/03/19 1434      Exercises   Exercises   Shoulder      Shoulder Exercises: Supine   Protraction  PROM;10 reps    Horizontal ABduction  PROM;10 reps    External Rotation  PROM;10 reps    Internal Rotation  PROM;10 reps    Flexion  PROM;10 reps    ABduction  PROM;10 reps      Shoulder Exercises: Seated   Extension  AROM;10 reps    Row  AROM;10 reps    Other Seated Exercises  scapular depression, A/ROM, 10X      Shoulder Exercises: Therapy Ball   Flexion  10 reps    ABduction  10 reps      Shoulder Exercises: Isometric Strengthening   Flexion  Supine;3X3"    Extension  Supine;3X3"    External Rotation  Supine;3X3"    Internal Rotation  Supine;3X3"    ABduction  Supine;3X3"    ADduction  Supine;3X3"      Manual Therapy   Manual Therapy  Myofascial release  Manual therapy comments  completed separately from therapeutic exercise    Myofascial Release  myofascial release and manual techniques completed to right upper arm, anterior shoulder, and trapezius regions to decrease pain and fascial restrictions and increase joint ROM.                OT Short Term Goals - 10/03/19 1505      OT SHORT TERM GOAL #1   Title  Pt will be provided with and educated on HEP to improve mobility in RUE required for ADL completion.    Time  4    Period  Weeks    Status  On-going    Target Date  10/31/19      OT SHORT TERM GOAL #2   Title  Pt will increase RUE P/ROM to [facility] to improve ability to perform dressing tasks with minimal compensatory movements.    Time  4    Period  Weeks    Status  On-going      OT SHORT TERM GOAL #3   Title  Pt will increase RUE strength to 3+/5 to improve ability to perform grooming tasks such as washing face, and eating tasks using dominant RUE.    Time  4    Period  Weeks    Status  On-going        OT Long Term Goals - 10/03/19 1505      OT LONG TERM GOAL #1   Title  Pt will decrease RUE pain to 3/10 or less to improve ability to sleep in the bed for 4 consecutive hours or greater at  night.    Time  8    Period  Weeks    Status  On-going      OT LONG TERM GOAL #2   Title  Pt will decrease fascial restrictions in RUE to trace amounts to improve mobility required for functional reaching tasks.    Time  8    Period  Weeks    Status  On-going      OT LONG TERM GOAL #3   Title  Pt will increase A/ROM of RUE to [facility] to improve ability to reach overhead and behind back during dressing, bathing, etc.    Time  8    Period  Weeks    Status  On-going      OT LONG TERM GOAL #4   Title  Pt will increase RUE strength to 4+/5 or more to improve ability to perform lifting tasks during ADLs and at work as needed.    Time  8    Period  Weeks    Status  On-going      OT LONG TERM GOAL #5   Title  Pt will return to highest level of functioning during ADLs and work tasks using RUE as dominant.    Time  8    Period  Weeks    Status  On-going            Plan - 10/03/19 1711    Clinical Impression Statement  A: Pt reports improvement in ROM with table slides. Initiated myofascial release and manual techniques to RUE to address fascial restrictions. Completed P/ROM with slight improvement in ROM from eval. Also initiated isometrics, scapular ROM, and therapy ball stretches. Verbal cuing for form and technique.    Body Structure / Function / Physical Skills  ADL;Endurance;UE functional use;Fascial restriction;Flexibility;Pain;ROM;IADL;Strength    Plan  P: Continue with manual therapy  and passive stretching,  increase isometrics to 5 second holds. Add shoulder glides    OT Home Exercise Plan  eval: table slides    Consulted and Agree with Plan of Care  Patient       Patient will benefit from skilled therapeutic intervention in order to improve the following deficits and impairments:   Body Structure / Function / Physical Skills: ADL, Endurance, UE functional use, Fascial restriction, Flexibility, Pain, ROM, IADL, Strength       Visit Diagnosis: Acute pain of right  shoulder  Stiffness of right shoulder, not elsewhere classified  Other symptoms and signs involving the musculoskeletal system    Problem List Patient Active Problem List   Diagnosis Date Noted  . Need for influenza vaccination 05/15/2019  . Acute pain of right shoulder 05/15/2019  . Skin lesion 03/22/2018  . Situational phobia 11/29/2016  . Tension headache 07/05/2016  . Routine general medical examination at a health care facility 03/29/2016  . Smoker 03/29/2016  . Vaccine counseling 03/29/2016  . Generalized anxiety disorder 03/29/2016   Guadelupe Sabin, OTR/L  [phone] 10/03/2019, 5:14 PM  [facility] [address] [place], [place], [address] Phone: [phone]   Fax:  [phone]  Name: Ketan Renz MRN: [id number] Date of Birth: 1975/09/23

## 2019-10-06 ENCOUNTER — Encounter (HOSPITAL_COMMUNITY): Payer: Self-pay | Admitting: Occupational Therapy

## 2019-10-06 ENCOUNTER — Ambulatory Visit (HOSPITAL_COMMUNITY): Payer: BC Managed Care – PPO | Admitting: Occupational Therapy

## 2019-10-06 ENCOUNTER — Other Ambulatory Visit: Payer: Self-pay

## 2019-10-06 DIAGNOSIS — M25511 Pain in right shoulder: Secondary | ICD-10-CM

## 2019-10-06 DIAGNOSIS — M25611 Stiffness of right shoulder, not elsewhere classified: Secondary | ICD-10-CM | POA: Diagnosis not present

## 2019-10-06 DIAGNOSIS — R29898 Other symptoms and signs involving the musculoskeletal system: Secondary | ICD-10-CM | POA: Diagnosis not present

## 2019-10-06 NOTE — Therapy (Signed)
[facility] [address] [place], [place], [address] Phone: [phone]   Fax:  [phone]  Occupational Therapy Treatment  Patient Details  Name: Vincent Walter MRN: [id number] Date of Birth: [birth year] Referring Provider (OT): Harriette Bouillon, [place]   Encounter Date: 10/06/2019  OT End of Session - 10/06/19 1342    Visit Number  3    Number of Visits  16    Date for OT Re-Evaluation  11/30/19   mini reassessment 10/30/19   Authorization Type  BCBS    OT Start Time  1256    OT Stop Time  1339    OT Time Calculation (min)  43 min    Activity Tolerance  Patient tolerated treatment well    Behavior During Therapy  [facility] for tasks assessed/performed       Past Medical History:  Diagnosis Date  . Anxiety   . Chronic back pain   . Headache syndrome    related to anxiety  . Tobacco abuse   . Wears glasses     Past Surgical History:  Procedure Laterality Date  . MANDIBLE FRACTURE SURGERY      There were no vitals filed for this visit.  Subjective Assessment - 10/06/19 1253    Subjective   S: It's been hurting this weekend.    Currently in Pain?  Yes    Pain Score  5     Pain Location  Shoulder    Pain Orientation  Right    Pain Descriptors / Indicators  Aching;Sore    Pain Type  Acute pain    Pain Radiating Towards  elbow    Pain Onset  More than a month ago    Pain Frequency  Intermittent    Aggravating Factors   movement, reaching    Pain Relieving Factors  rest    Effect of Pain on Daily Activities  max effect on ADL    Multiple Pain Sites  No         [facility] OT Assessment - 10/06/19 1253      Assessment   Medical Diagnosis  s/p right bicep tenodesis      Precautions   Precautions  Shoulder    Type of Shoulder Precautions  P/ROM, AA/ROM; no strengthening until approved by MD               OT Treatments/Exercises (OP) - 10/06/19 1258      Exercises   Exercises  Shoulder      Shoulder Exercises:  Supine   Protraction  PROM;10 reps    Horizontal ABduction  PROM;10 reps    External Rotation  PROM;10 reps    Internal Rotation  PROM;10 reps    Flexion  PROM;10 reps    ABduction  PROM;10 reps      Shoulder Exercises: Seated   Extension  AROM;10 reps    Row  AROM;10 reps    Other Seated Exercises  scapular depression, A/ROM, 10X      Shoulder Exercises: Pulleys   Flexion  1 minute    Scaption  1 minute      Shoulder Exercises: Therapy Ball   Flexion  10 reps    ABduction  10 reps      Shoulder Exercises: ROM/Strengthening   Anterior Glide  3X10"    Caudal Glide  3X10"      Shoulder Exercises: Isometric Strengthening   Flexion  Supine;3X5"    Extension  Supine;3X5"    External Rotation  Supine;3X5"    Internal Rotation  Supine;3X5"    ABduction  Supine;3X5"    ADduction  Supine;3X5"      Manual Therapy   Manual Therapy  Myofascial release;Muscle Energy Technique    Manual therapy comments  completed separately from therapeutic exercise    Myofascial Release  myofascial release and manual techniques completed to right upper arm, anterior shoulder, and trapezius regions to decrease pain and fascial restrictions and increase joint ROM.     Muscle Energy Technique  Muscle energy technique to right shoulder flexors and external rotators to increase ROM             OT Education - 10/06/19 1326    Education Details  scapular A/ROM-extension & row    Person(s) Educated  Patient    Methods  Explanation;Demonstration;Handout    Comprehension  Verbalized understanding;Returned demonstration       OT Short Term Goals - 10/03/19 1505      OT SHORT TERM GOAL #1   Title  Pt will be provided with and educated on HEP to improve mobility in RUE required for ADL completion.    Time  4    Period  Weeks    Status  On-going    Target Date  10/31/19      OT SHORT TERM GOAL #2   Title  Pt will increase RUE P/ROM to [facility] to improve ability to perform dressing tasks with  minimal compensatory movements.    Time  4    Period  Weeks    Status  On-going      OT SHORT TERM GOAL #3   Title  Pt will increase RUE strength to 3+/5 to improve ability to perform grooming tasks such as washing face, and eating tasks using dominant RUE.    Time  4    Period  Weeks    Status  On-going        OT Long Term Goals - 10/03/19 1505      OT LONG TERM GOAL #1   Title  Pt will decrease RUE pain to 3/10 or less to improve ability to sleep in the bed for 4 consecutive hours or greater at night.    Time  8    Period  Weeks    Status  On-going      OT LONG TERM GOAL #2   Title  Pt will decrease fascial restrictions in RUE to trace amounts to improve mobility required for functional reaching tasks.    Time  8    Period  Weeks    Status  On-going      OT LONG TERM GOAL #3   Title  Pt will increase A/ROM of RUE to [facility] to improve ability to reach overhead and behind back during dressing, bathing, etc.    Time  8    Period  Weeks    Status  On-going      OT LONG TERM GOAL #4   Title  Pt will increase RUE strength to 4+/5 or more to improve ability to perform lifting tasks during ADLs and at work as needed.    Time  8    Period  Weeks    Status  On-going      OT LONG TERM GOAL #5   Title  Pt will return to highest level of functioning during ADLs and work tasks using RUE as dominant.    Time  8    Period  Weeks  Status  On-going            Plan - 10/06/19 1327    Clinical Impression Statement  A: Pt reports soreness over the weekend, continuing to complete HEP. Continued with manual technique to address fascial restrictions. Pt able to tolerate slightly increased ROM during passive stretching, added muscle energy technique to improve ROM for flexion and er. Added shoulder glides and pulleys today. Pt is limited to approximately 80-90 degrees during pulleys. Verbal cuing for form and technique.    Body Structure / Function / Physical Skills  ADL;Endurance;UE  functional use;Fascial restriction;Flexibility;Pain;ROM;IADL;Strength    Plan  P: Add AA/ROM in supine working to improve ROM, continue with manual therapy and passive stretching    OT Home Exercise Plan  eval: table slides; 5/17: scapular A/ROM    Consulted and Agree with Plan of Care  Patient       Patient will benefit from skilled therapeutic intervention in order to improve the following deficits and impairments:   Body Structure / Function / Physical Skills: ADL, Endurance, UE functional use, Fascial restriction, Flexibility, Pain, ROM, IADL, Strength       Visit Diagnosis: Acute pain of right shoulder  Stiffness of right shoulder, not elsewhere classified  Other symptoms and signs involving the musculoskeletal system    Problem List Patient Active Problem List   Diagnosis Date Noted  . Need for influenza vaccination 05/15/2019  . Acute pain of right shoulder 05/15/2019  . Skin lesion 03/22/2018  . Situational phobia 11/29/2016  . Tension headache 07/05/2016  . Routine general medical examination at a health care facility 03/29/2016  . Smoker 03/29/2016  . Vaccine counseling 03/29/2016  . Generalized anxiety disorder 03/29/2016   Guadelupe Sabin, OTR/L  [phone] 10/06/2019, 1:43 PM  [facility] [address] [place], [place], [address] Phone: [phone]   Fax:  [phone]  Name: Gearld Kerstein MRN: [id number] Date of Birth: 1976-03-10

## 2019-10-06 NOTE — Patient Instructions (Signed)
1) Seated Row   Sit up straight with elbows by your sides. Pull back with shoulders/elbows, keeping forearms straight, as if pulling back on the reins of a horse. Squeeze shoulder blades together. Repeat _10-15__times, __2-3__sets/day    2) Shoulder Extension    Sit up straight with both arms by your side, draw your arms back behind your waist. Keep your elbows straight. Repeat __10-15__times, __2-3__sets/day.       

## 2019-10-09 ENCOUNTER — Ambulatory Visit (HOSPITAL_COMMUNITY): Payer: BC Managed Care – PPO | Admitting: Specialist

## 2019-10-09 ENCOUNTER — Telehealth (HOSPITAL_COMMUNITY): Payer: Self-pay | Admitting: Specialist

## 2019-10-09 NOTE — Telephone Encounter (Signed)
Patient called to cx and r/s for Monday with [facility]

## 2019-10-10 ENCOUNTER — Telehealth (HOSPITAL_COMMUNITY): Payer: Self-pay | Admitting: Occupational Therapy

## 2019-10-10 NOTE — Telephone Encounter (Signed)
Called pt about appts next week. Wife answered and took message, pt was in dentist appt. Explained POC is for 2x/week only, wife said to cancel Monday's and he will attend Wed. & Chauncy Lean, OTR/L  [phone] 10/10/19

## 2019-10-13 ENCOUNTER — Ambulatory Visit (HOSPITAL_COMMUNITY): Payer: BC Managed Care – PPO | Admitting: Occupational Therapy

## 2019-10-15 ENCOUNTER — Ambulatory Visit (INDEPENDENT_AMBULATORY_CARE_PROVIDER_SITE_OTHER): Payer: BC Managed Care – PPO | Admitting: Orthopedic Surgery

## 2019-10-15 ENCOUNTER — Encounter: Payer: Self-pay | Admitting: Orthopedic Surgery

## 2019-10-15 ENCOUNTER — Other Ambulatory Visit: Payer: Self-pay

## 2019-10-15 DIAGNOSIS — M67813 Other specified disorders of tendon, right shoulder: Secondary | ICD-10-CM

## 2019-10-15 MED ORDER — METHOCARBAMOL 500 MG PO TABS: 500.0000 mg | ORAL_TABLET | Freq: Three times a day (TID) | ORAL | 0 refills | Status: DC | PRN

## 2019-10-16 ENCOUNTER — Encounter (HOSPITAL_COMMUNITY): Payer: Self-pay | Admitting: Occupational Therapy

## 2019-10-16 ENCOUNTER — Ambulatory Visit (HOSPITAL_COMMUNITY): Payer: BC Managed Care – PPO | Admitting: Occupational Therapy

## 2019-10-16 ENCOUNTER — Other Ambulatory Visit: Payer: Self-pay

## 2019-10-16 DIAGNOSIS — R29898 Other symptoms and signs involving the musculoskeletal system: Secondary | ICD-10-CM | POA: Diagnosis not present

## 2019-10-16 DIAGNOSIS — M25611 Stiffness of right shoulder, not elsewhere classified: Secondary | ICD-10-CM

## 2019-10-16 DIAGNOSIS — M25511 Pain in right shoulder: Secondary | ICD-10-CM | POA: Diagnosis not present

## 2019-10-16 NOTE — Therapy (Signed)
[facility] [place], [place], [address] Phone: [phone]   Fax:  [phone]  Occupational Therapy Treatment  Patient Details  Name: Vincent Walter MRN: [id number] Date of Birth: [birth year] Referring Provider (OT): Gloriann Loan, [place]   Encounter Date: 10/16/2019  OT End of Session - 10/16/19 1021    Visit Number  4    Number of Visits  16    Date for OT Re-Evaluation  11/30/19   mini reassessment 10/30/19   Authorization Type  BCBS    OT Start Time  0940    OT Stop Time  1021    OT Time Calculation (min)  41 min    Activity Tolerance  Patient tolerated treatment well    Behavior During Therapy  [facility] for tasks assessed/performed       Past Medical History:  Diagnosis Date  . Anxiety   . Chronic back pain   . Headache syndrome    related to anxiety  . Tobacco abuse   . Wears glasses     Past Surgical History:  Procedure Laterality Date  . MANDIBLE FRACTURE SURGERY      There were no vitals filed for this visit.  Subjective Assessment - 10/16/19 0942    Subjective   S: My elbow has been bothering me some, but it's fine today.    Currently in Pain?  No/denies         [facility] OT Assessment - 10/16/19 0941      Assessment   Medical Diagnosis  s/p right bicep tenodesis      Precautions   Precautions  Shoulder    Type of Shoulder Precautions  P/ROM, AA/ROM; ok to begin strengthening on 10/29/19               OT Treatments/Exercises (OP) - 10/16/19 0943      Exercises   Exercises  Shoulder      Shoulder Exercises: Supine   Protraction  PROM;5 reps;AAROM;10 reps    Horizontal ABduction  PROM;5 reps;AAROM;10 reps    External Rotation  PROM;5 reps;AAROM;10 reps    Internal Rotation  PROM;5 reps;AAROM;10 reps    Flexion  PROM;5 reps;AAROM;10 reps    ABduction  PROM;5 reps;AAROM;10 reps      Shoulder Exercises: Pulleys   Flexion  1 minute    Scaption  1 minute      Shoulder Exercises:  ROM/Strengthening   Anterior Glide  3X10"      Manual Therapy   Manual Therapy  Myofascial release;Muscle Energy Technique;Other (comment)    Manual therapy comments  completed separately from therapeutic exercise    Myofascial Release  myofascial release and manual techniques completed to right upper arm, anterior shoulder, and trapezius regions to decrease pain and fascial restrictions and increase joint ROM.     Other Manual Therapy  Rebounding completed to RUE with pt in prone position, 2'     Muscle Energy Technique  Muscle energy technique to right shoulder flexors and external rotators to increase ROM               OT Short Term Goals - 10/03/19 1505      OT SHORT TERM GOAL #1   Title  Pt will be provided with and educated on HEP to improve mobility in RUE required for ADL completion.    Time  4    Period  Weeks    Status  On-going    Target Date  10/31/19  OT SHORT TERM GOAL #2   Title  Pt will increase RUE P/ROM to [facility] to improve ability to perform dressing tasks with minimal compensatory movements.    Time  4    Period  Weeks    Status  On-going      OT SHORT TERM GOAL #3   Title  Pt will increase RUE strength to 3+/5 to improve ability to perform grooming tasks such as washing face, and eating tasks using dominant RUE.    Time  4    Period  Weeks    Status  On-going        OT Long Term Goals - 10/03/19 1505      OT LONG TERM GOAL #1   Title  Pt will decrease RUE pain to 3/10 or less to improve ability to sleep in the bed for 4 consecutive hours or greater at night.    Time  8    Period  Weeks    Status  On-going      OT LONG TERM GOAL #2   Title  Pt will decrease fascial restrictions in RUE to trace amounts to improve mobility required for functional reaching tasks.    Time  8    Period  Weeks    Status  On-going      OT LONG TERM GOAL #3   Title  Pt will increase A/ROM of RUE to [facility] to improve ability to reach overhead and behind back during  dressing, bathing, etc.    Time  8    Period  Weeks    Status  On-going      OT LONG TERM GOAL #4   Title  Pt will increase RUE strength to 4+/5 or more to improve ability to perform lifting tasks during ADLs and at work as needed.    Time  8    Period  Weeks    Status  On-going      OT LONG TERM GOAL #5   Title  Pt will return to highest level of functioning during ADLs and work tasks using RUE as dominant.    Time  8    Period  Weeks    Status  On-going            Plan - 10/16/19 1012    Clinical Impression Statement  A: Pt reports pain in his elbow at times, shoulder has been a little better lately, HEP is going well. Continued with manual therapy adding rebounding technique today to address fascial restrictions and increase ROM. Pt with improving tolerance to passive stretching, added AA/ROM in supine today. Verbal cuing for form and technique.    Body Structure / Function / Physical Skills  ADL;Endurance;UE functional use;Fascial restriction;Flexibility;Pain;ROM;IADL;Strength    Plan  P: Add AA/ROM in standing and update for HEP    OT Home Exercise Plan  eval: table slides; 5/17: scapular A/ROM    Consulted and Agree with Plan of Care  Patient       Patient will benefit from skilled therapeutic intervention in order to improve the following deficits and impairments:   Body Structure / Function / Physical Skills: ADL, Endurance, UE functional use, Fascial restriction, Flexibility, Pain, ROM, IADL, Strength       Visit Diagnosis: Acute pain of right shoulder  Stiffness of right shoulder, not elsewhere classified  Other symptoms and signs involving the musculoskeletal system    Problem List Patient Active Problem List   Diagnosis Date Noted  . Need  for influenza vaccination 05/15/2019  . Acute pain of right shoulder 05/15/2019  . Skin lesion 03/22/2018  . Situational phobia 11/29/2016  . Tension headache 07/05/2016  . Routine general medical examination at a  health care facility 03/29/2016  . Smoker 03/29/2016  . Vaccine counseling 03/29/2016  . Generalized anxiety disorder 03/29/2016   Ezra Sites, OTR/L  [phone] 10/16/2019, 10:22 AM  [facility] [address] [place], [place], [address] Phone: [phone]   Fax:  [phone]  Name: Raliegh Scobie MRN: [id number] Date of Birth: February 12, 1976

## 2019-10-17 ENCOUNTER — Encounter (HOSPITAL_COMMUNITY): Payer: Self-pay | Admitting: Occupational Therapy

## 2019-10-17 ENCOUNTER — Other Ambulatory Visit: Payer: Self-pay | Admitting: Orthopedic Surgery

## 2019-10-17 ENCOUNTER — Ambulatory Visit (HOSPITAL_COMMUNITY): Payer: BC Managed Care – PPO | Admitting: Occupational Therapy

## 2019-10-17 DIAGNOSIS — R29898 Other symptoms and signs involving the musculoskeletal system: Secondary | ICD-10-CM | POA: Diagnosis not present

## 2019-10-17 DIAGNOSIS — M25611 Stiffness of right shoulder, not elsewhere classified: Secondary | ICD-10-CM | POA: Diagnosis not present

## 2019-10-17 DIAGNOSIS — M25511 Pain in right shoulder: Secondary | ICD-10-CM

## 2019-10-17 NOTE — Patient Instructions (Signed)
Perform each exercise ____10-15____ reps. 2-3x days.   1) Protraction   Start by holding a wand or cane at chest height.  Next, slowly push the wand outwards in front of your body so that your elbows become fully straightened. Then, return to the original position.     2) Shoulder FLEXION   In the standing position, hold a wand/cane with both arms, palms down on both sides. Raise up the wand/cane allowing your unaffected arm to perform most of the effort. Your affected arm should be partially relaxed.      3) Internal/External ROTATION   In the standing position, hold a wand/cane with both hands keeping your elbows bent. Move your arms and wand/cane to one side.  Your affected arm should be partially relaxed while your unaffected arm performs most of the effort.       4) Shoulder ABDUCTION   While holding a wand/cane palm face up on the injured side and palm face down on the uninjured side, slowly raise up your injured arm to the side.        5) Horizontal Abduction/Adduction   Straight arms holding cane at shoulder height, bring cane to right, center, left.    Copyright  VHI. All rights reserved.

## 2019-10-17 NOTE — Telephone Encounter (Signed)
Please advise 

## 2019-10-17 NOTE — Therapy (Signed)
[facility] [address] [place], [place], [address] Phone: [phone]   Fax:  [phone]  Occupational Therapy Treatment  Patient Details  Name: Vincent Walter MRN: [id number] Date of Birth: 1976/03/28 Referring Provider (OT): Harriette Bouillon, [place]   Encounter Date: 10/17/2019  OT End of Session - 10/17/19 1025    Visit Number  5    Number of Visits  16    Date for OT Re-Evaluation  11/30/19   mini reassessment 10/30/19   Authorization Type  BCBS    OT Start Time  0949    OT Stop Time  1027    OT Time Calculation (min)  38 min    Activity Tolerance  Patient tolerated treatment well    Behavior During Therapy  [facility] for tasks assessed/performed       Past Medical History:  Diagnosis Date  . Anxiety   . Chronic back pain   . Headache syndrome    related to anxiety  . Tobacco abuse   . Wears glasses     Past Surgical History:  Procedure Laterality Date  . MANDIBLE FRACTURE SURGERY      There were no vitals filed for this visit.  Subjective Assessment - 10/17/19 0950    Subjective   S: I did some weedeating yesterday.    Currently in Pain?  No/denies         [facility] OT Assessment - 10/17/19 0950      Assessment   Medical Diagnosis  s/p right bicep tenodesis      Precautions   Precautions  Shoulder    Type of Shoulder Precautions  P/ROM, AA/ROM; ok to begin strengthening on 10/29/19               OT Treatments/Exercises (OP) - 10/17/19 0950      Exercises   Exercises  Shoulder      Shoulder Exercises: Supine   Protraction  PROM;5 reps;AAROM;10 reps    Horizontal ABduction  PROM;5 reps;AAROM;10 reps    External Rotation  PROM;5 reps;AAROM;10 reps    Internal Rotation  PROM;5 reps;AAROM;10 reps    Flexion  PROM;5 reps;AAROM;10 reps    ABduction  PROM;5 reps;AAROM;10 reps      Shoulder Exercises: Standing   Protraction  AAROM;10 reps    Horizontal ABduction  AAROM;10 reps    External Rotation   AAROM;10 reps    Internal Rotation  AAROM;10 reps    Flexion  AAROM;10 reps    ABduction  AAROM;10 reps      Manual Therapy   Manual Therapy  Myofascial release;Muscle Energy Technique    Manual therapy comments  completed separately from therapeutic exercise    Myofascial Release  myofascial release and manual techniques completed to right upper arm, anterior shoulder, and trapezius regions to decrease pain and fascial restrictions and increase joint ROM.     Muscle Energy Technique  Muscle energy technique to right shoulder flexors and external rotators to increase ROM             OT Education - 10/17/19 1022    Education Details  shoulder AA/ROM    Person(s) Educated  Patient    Methods  Explanation;Demonstration;Handout    Comprehension  Verbalized understanding;Returned demonstration       OT Short Term Goals - 10/03/19 1505      OT SHORT TERM GOAL #1   Title  Pt will be provided with and educated on HEP to improve mobility in RUE  required for ADL completion.    Time  4    Period  Weeks    Status  On-going    Target Date  10/31/19      OT SHORT TERM GOAL #2   Title  Pt will increase RUE P/ROM to [facility] to improve ability to perform dressing tasks with minimal compensatory movements.    Time  4    Period  Weeks    Status  On-going      OT SHORT TERM GOAL #3   Title  Pt will increase RUE strength to 3+/5 to improve ability to perform grooming tasks such as washing face, and eating tasks using dominant RUE.    Time  4    Period  Weeks    Status  On-going        OT Long Term Goals - 10/03/19 1505      OT LONG TERM GOAL #1   Title  Pt will decrease RUE pain to 3/10 or less to improve ability to sleep in the bed for 4 consecutive hours or greater at night.    Time  8    Period  Weeks    Status  On-going      OT LONG TERM GOAL #2   Title  Pt will decrease fascial restrictions in RUE to trace amounts to improve mobility required for functional reaching tasks.     Time  8    Period  Weeks    Status  On-going      OT LONG TERM GOAL #3   Title  Pt will increase A/ROM of RUE to [facility] to improve ability to reach overhead and behind back during dressing, bathing, etc.    Time  8    Period  Weeks    Status  On-going      OT LONG TERM GOAL #4   Title  Pt will increase RUE strength to 4+/5 or more to improve ability to perform lifting tasks during ADLs and at work as needed.    Time  8    Period  Weeks    Status  On-going      OT LONG TERM GOAL #5   Title  Pt will return to highest level of functioning during ADLs and work tasks using RUE as dominant.    Time  8    Period  Weeks    Status  On-going            Plan - 10/17/19 1023    Clinical Impression Statement  A: Pt reports his arm feels pretty good today. Continued with manual techniques to address fascial restrictions and improve ROM. Continued with passive stretching, pt gaining slight ROM today. Added AA/ROM in standing and updated HEP for AA/ROM. Verbal cuing for form and technique.    Body Structure / Function / Physical Skills  ADL;Endurance;UE functional use;Fascial restriction;Flexibility;Pain;ROM;IADL;Strength    Plan  P: Follow up on HEP, resume pulleys, begin wall stretches if pt able to tolerate    OT Home Exercise Plan  eval: table slides; 5/17: scapular A/ROM; 5/28: AA/ROM    Consulted and Agree with Plan of Care  Patient       Patient will benefit from skilled therapeutic intervention in order to improve the following deficits and impairments:   Body Structure / Function / Physical Skills: ADL, Endurance, UE functional use, Fascial restriction, Flexibility, Pain, ROM, IADL, Strength       Visit Diagnosis: Acute pain of right shoulder  Stiffness of right shoulder, not elsewhere classified  Other symptoms and signs involving the musculoskeletal system    Problem List Patient Active Problem List   Diagnosis Date Noted  . Need for influenza vaccination 05/15/2019   . Acute pain of right shoulder 05/15/2019  . Skin lesion 03/22/2018  . Situational phobia 11/29/2016  . Tension headache 07/05/2016  . Routine general medical examination at a health care facility 03/29/2016  . Smoker 03/29/2016  . Vaccine counseling 03/29/2016  . Generalized anxiety disorder 03/29/2016   Ezra Sites, OTR/L  [phone] 10/17/2019, 10:27 AM  [facility] [address] [place], [place], [address] Phone: [phone]   Fax:  [phone]  Name: Vincent Walter MRN: [id number] Date of Birth: 1975/10/21

## 2019-10-20 ENCOUNTER — Encounter: Payer: Self-pay | Admitting: Orthopedic Surgery

## 2019-10-20 NOTE — Progress Notes (Signed)
Post-Op Visit Note   Patient: Vincent Walter           Date of Birth: 12-Apr-1976           MRN: [id number] Visit Date: 10/15/2019 PCP: Jac Canavan, PA-C   Assessment & Plan:  Chief Complaint:  Chief Complaint  Patient presents with  . Right Shoulder - Pain   Visit Diagnoses:  1. Biceps tendonosis of right shoulder     Plan: Patient is a 44 year old male who presents s/p right shoulder biceps tenodesis with manipulation under anesthesia on 09/15/2019.  Patient notes that he is doing a little better compared with his previous visit.  He is doing physical therapy twice a week where they are working primarily on range of motion without strengthening yet.  He is also doing a home exercise program.  He is taking oxycodone on and methocarbamol as needed for pain as well as naproxen.  On exam he does have some limited range of motion with 75 degrees abduction, 85 degrees forward flexion, 15 degrees external rotation.  He has good biceps strength and a symmetric contour of the bicep compared with the contralateral arm.  Incision is healing well.  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  Provided a work note keeping patient out of work for the next 4 weeks.  Also provided a note for physical therapy stating that patient is okay for strengthening work in 2 weeks.  Follow-up in 4 weeks for clinical recheck.  Follow-Up Instructions: No follow-ups on file.   Orders:  No orders of the defined types were placed in this encounter.  Meds ordered this encounter  Medications  . methocarbamol (ROBAXIN) 500 MG tablet    Sig: Take 1 tablet (500 mg total) by mouth every 8 (eight) hours as needed.    Dispense:  40 tablet    Refill:  0    Imaging: No results found.  PMFS History: Patient Active Problem List   Diagnosis Date Noted  . Need for influenza vaccination 05/15/2019  . Acute pain of right shoulder 05/15/2019  . Skin lesion  03/22/2018  . Situational phobia 11/29/2016  . Tension headache 07/05/2016  . Routine general medical examination at a health care facility 03/29/2016  . Smoker 03/29/2016  . Vaccine counseling 03/29/2016  . Generalized anxiety disorder 03/29/2016   Past Medical History:  Diagnosis Date  . Anxiety   . Chronic back pain   . Headache syndrome    related to anxiety  . Tobacco abuse   . Wears glasses     Family History  Problem Relation Age of Onset  . Alcohol abuse Mother   . Hypertension Father   . Cancer Father        skin  . Hyperlipidemia Brother   . Hypertension Brother   . Anxiety disorder Daughter   . Cancer Paternal Grandfather   . Heart disease Neg Hx   . Other Neg Hx   . Stroke Neg Hx   . Diabetes Neg Hx     Past Surgical History:  Procedure Laterality Date  . MANDIBLE FRACTURE SURGERY     Social History   Occupational History  . Occupation: Merchandiser, retail  Tobacco Use  . Smoking status: Current Every Day Smoker    Packs/day: 1.00    Years: 27.00    Pack years: 27.00    Types: Cigarettes  . Smokeless tobacco: Current User  Substance and Sexual Activity  .  Alcohol use: Yes    Alcohol/week: 7.0 standard drinks    Types: 7 Cans of beer per week    Comment: occasional  . Drug use: No  . Sexual activity: Not on file

## 2019-10-22 ENCOUNTER — Encounter (HOSPITAL_COMMUNITY): Payer: Self-pay | Admitting: Occupational Therapy

## 2019-10-22 ENCOUNTER — Ambulatory Visit (HOSPITAL_COMMUNITY): Payer: BC Managed Care – PPO | Attending: Surgical | Admitting: Occupational Therapy

## 2019-10-22 ENCOUNTER — Other Ambulatory Visit: Payer: Self-pay

## 2019-10-22 DIAGNOSIS — R29898 Other symptoms and signs involving the musculoskeletal system: Secondary | ICD-10-CM | POA: Diagnosis not present

## 2019-10-22 DIAGNOSIS — M25611 Stiffness of right shoulder, not elsewhere classified: Secondary | ICD-10-CM

## 2019-10-22 DIAGNOSIS — M25511 Pain in right shoulder: Secondary | ICD-10-CM | POA: Insufficient documentation

## 2019-10-22 NOTE — Therapy (Signed)
[place], [place], [address] Phone: [phone]   Fax:  [phone]  Occupational Therapy Treatment  Patient Details  Name: Vincent Walter MRN: [id number] Date of Birth: 05/21/1976 Referring Provider (OT): Gloriann Loan, [place]   Encounter Date: 10/22/2019  OT End of Session - 10/22/19 1646    Visit Number  6    Number of Visits  16    Date for OT Re-Evaluation  11/30/19   mini reassessment 10/30/19   Authorization Type  BCBS    OT Start Time  1604    OT Stop Time  1645    OT Time Calculation (min)  41 min    Activity Tolerance  Patient tolerated treatment well    Behavior During Therapy  [facility] for tasks assessed/performed       Past Medical History:  Diagnosis Date  . Anxiety   . Chronic back pain   . Headache syndrome    related to anxiety  . Tobacco abuse   . Wears glasses     Past Surgical History:  Procedure Laterality Date  . MANDIBLE FRACTURE SURGERY      There were no vitals filed for this visit.  Subjective Assessment - 10/22/19 1605    Subjective   S: It's been hurting today and last night.    Currently in Pain?  Yes    Pain Score  5     Pain Location  Shoulder    Pain Orientation  Right    Pain Descriptors / Indicators  Aching;Sore    Pain Type  Acute pain    Pain Radiating Towards  elbow    Pain Onset  More than a month ago    Pain Frequency  Intermittent    Aggravating Factors   movement, reaching    Pain Relieving Factors  rest    Effect of Pain on Daily Activities  mod effect on ADLs    Multiple Pain Sites  No         [facility] OT Assessment - 10/22/19 1605      Assessment   Medical Diagnosis  s/p right bicep tenodesis      Precautions   Precautions  Shoulder    Type of Shoulder Precautions  P/ROM, AA/ROM; ok to begin strengthening on 10/29/19               OT Treatments/Exercises (OP) - 10/22/19 1606      Exercises   Exercises  Shoulder      Shoulder  Exercises: Supine   Protraction  PROM;5 reps;AAROM;10 reps    Horizontal ABduction  PROM;5 reps;AAROM;10 reps    External Rotation  PROM;5 reps;AAROM;10 reps    Internal Rotation  PROM;5 reps;AAROM;10 reps    Flexion  PROM;5 reps;AAROM;10 reps    ABduction  PROM;5 reps;AAROM;10 reps      Shoulder Exercises: Pulleys   Flexion  1 minute    Scaption  1 minute      Shoulder Exercises: Stretch   Internal Rotation Stretch  3 reps   10" horizontal towel   External Rotation Stretch  3 reps;10 seconds    Wall Stretch - Flexion  3 reps;10 seconds    Wall Stretch - ABduction  3 reps;10 seconds    Other Shoulder Stretches  doorway stretch, 3x10"      Manual Therapy   Manual Therapy  Myofascial release;Muscle Energy Technique    Manual therapy comments  completed separately from therapeutic exercise  Myofascial Release  myofascial release and manual techniques completed to right upper arm, anterior shoulder, and trapezius regions to decrease pain and fascial restrictions and increase joint ROM.     Muscle Energy Technique  Muscle energy technique to right shoulder flexors and external rotators to increase ROM               OT Short Term Goals - 10/03/19 1505      OT SHORT TERM GOAL #1   Title  Pt will be provided with and educated on HEP to improve mobility in RUE required for ADL completion.    Time  4    Period  Weeks    Status  On-going    Target Date  10/31/19      OT SHORT TERM GOAL #2   Title  Pt will increase RUE P/ROM to [facility] to improve ability to perform dressing tasks with minimal compensatory movements.    Time  4    Period  Weeks    Status  On-going      OT SHORT TERM GOAL #3   Title  Pt will increase RUE strength to 3+/5 to improve ability to perform grooming tasks such as washing face, and eating tasks using dominant RUE.    Time  4    Period  Weeks    Status  On-going        OT Long Term Goals - 10/03/19 1505      OT LONG TERM GOAL #1   Title  Pt will  decrease RUE pain to 3/10 or less to improve ability to sleep in the bed for 4 consecutive hours or greater at night.    Time  8    Period  Weeks    Status  On-going      OT LONG TERM GOAL #2   Title  Pt will decrease fascial restrictions in RUE to trace amounts to improve mobility required for functional reaching tasks.    Time  8    Period  Weeks    Status  On-going      OT LONG TERM GOAL #3   Title  Pt will increase A/ROM of RUE to [facility] to improve ability to reach overhead and behind back during dressing, bathing, etc.    Time  8    Period  Weeks    Status  On-going      OT LONG TERM GOAL #4   Title  Pt will increase RUE strength to 4+/5 or more to improve ability to perform lifting tasks during ADLs and at work as needed.    Time  8    Period  Weeks    Status  On-going      OT LONG TERM GOAL #5   Title  Pt will return to highest level of functioning during ADLs and work tasks using RUE as dominant.    Time  8    Period  Weeks    Status  On-going            Plan - 10/22/19 1624    Clinical Impression Statement  A: Pt reports soreness over the past day or so, has been completing HEP and it's going well. Continued with myofascial release to address fascial restrictions and improve ROM. Pt limited to P/ROM slightly above 90 degrees for flexion and abduction, er limited to approximately 30 degrees. Continued with AA/ROM and added shoulder stretches today. Continued with pulleys. Verbal cuing for form and technique.  Body Structure / Function / Physical Skills  ADL;Endurance;UE functional use;Fascial restriction;Flexibility;Pain;ROM;IADL;Strength    Plan  P: Continue with shoulder stretches and add cross chest stretch. Update for HEP (smartphrase is .sstretch)    OT Home Exercise Plan  eval: table slides; 5/17: scapular A/ROM; 5/28: AA/ROM    Consulted and Agree with Plan of Care  Patient       Patient will benefit from skilled therapeutic intervention in order to improve  the following deficits and impairments:   Body Structure / Function / Physical Skills: ADL, Endurance, UE functional use, Fascial restriction, Flexibility, Pain, ROM, IADL, Strength       Visit Diagnosis: Acute pain of right shoulder  Stiffness of right shoulder, not elsewhere classified  Other symptoms and signs involving the musculoskeletal system    Problem List Patient Active Problem List   Diagnosis Date Noted  . Need for influenza vaccination 05/15/2019  . Acute pain of right shoulder 05/15/2019  . Skin lesion 03/22/2018  . Situational phobia 11/29/2016  . Tension headache 07/05/2016  . Routine general medical examination at a health care facility 03/29/2016  . Smoker 03/29/2016  . Vaccine counseling 03/29/2016  . Generalized anxiety disorder 03/29/2016   Ezra Sites, OTR/L  [phone] 10/22/2019, 4:48 PM  [facility] [address] [place], [place], [address] Phone: [phone]   Fax:  [phone]  Name: Gaje Tennyson MRN: [id number] Date of Birth: 1976-01-19

## 2019-10-24 ENCOUNTER — Ambulatory Visit (HOSPITAL_COMMUNITY): Payer: BC Managed Care – PPO | Admitting: Occupational Therapy

## 2019-10-24 ENCOUNTER — Encounter (HOSPITAL_COMMUNITY): Payer: Self-pay | Admitting: Occupational Therapy

## 2019-10-24 ENCOUNTER — Other Ambulatory Visit: Payer: Self-pay

## 2019-10-24 DIAGNOSIS — R29898 Other symptoms and signs involving the musculoskeletal system: Secondary | ICD-10-CM | POA: Diagnosis not present

## 2019-10-24 DIAGNOSIS — M25611 Stiffness of right shoulder, not elsewhere classified: Secondary | ICD-10-CM | POA: Diagnosis not present

## 2019-10-24 DIAGNOSIS — M25511 Pain in right shoulder: Secondary | ICD-10-CM

## 2019-10-24 NOTE — Therapy (Signed)
[facility] [address] [place], [place], [address] Phone: [phone]   Fax:  [phone]  Occupational Therapy Treatment  Patient Details  Name: Vincent Walter MRN: [id number] Date of Birth: [birth year] Referring Provider (OT): Harriette Bouillon, [place]   Encounter Date: 10/24/2019  OT End of Session - 10/24/19 1258    Visit Number  7    Number of Visits  16    Date for OT Re-Evaluation  11/30/19    Authorization Type  BCBS    OT Start Time  1039    OT Stop Time  1113    OT Time Calculation (min)  34 min    Activity Tolerance  Patient tolerated treatment well    Behavior During Therapy  [facility] for tasks assessed/performed       Past Medical History:  Diagnosis Date  . Anxiety   . Chronic back pain   . Headache syndrome    related to anxiety  . Tobacco abuse   . Wears glasses     Past Surgical History:  Procedure Laterality Date  . MANDIBLE FRACTURE SURGERY      There were no vitals filed for this visit.  Subjective Assessment - 10/24/19 1039    Subjective   S: it's feeling pretty good today, it feels better when I don't go 5 days between therapy    Currently in Pain?  Yes    Pain Score  4     Pain Location  Shoulder    Pain Orientation  Right    Pain Descriptors / Indicators  Aching;Sore    Pain Type  Acute pain    Pain Radiating Towards  Elbow    Pain Onset  More than a month ago    Pain Frequency  Intermittent    Aggravating Factors   movement, reaching    Pain Relieving Factors  rest    Effect of Pain on Daily Activities  mod effect on ADLs    Multiple Pain Sites  No                   OT Treatments/Exercises (OP) - 10/24/19 1042      Exercises   Exercises  Shoulder      Shoulder Exercises: Supine   Protraction  PROM;5 reps;AAROM;10 reps    Horizontal ABduction  PROM;5 reps;AAROM;10 reps    External Rotation  PROM;5 reps;AAROM;10 reps    Internal Rotation  PROM;5 reps;AAROM;10 reps    Flexion   PROM;5 reps;AAROM;10 reps    ABduction  PROM;5 reps;AAROM;10 reps      Shoulder Exercises: Standing   Protraction  AAROM;10 reps    Horizontal ABduction  AAROM;10 reps    External Rotation  AAROM;10 reps    Internal Rotation  AAROM;10 reps    Flexion  AAROM;10 reps    ABduction  AAROM;10 reps      Shoulder Exercises: Pulleys   Flexion  1 minute    Scaption  1 minute      Manual Therapy   Manual Therapy  Myofascial release;Muscle Energy Technique    Manual therapy comments  completed separately from therapeutic exercise    Myofascial Release  myofascial release and manual techniques completed to right upper arm, anterior shoulder, and trapezius regions to decrease pain and fascial restrictions and increase joint ROM.     Muscle Energy Technique  Muscle energy technique to right shoulder flexors and external rotators to increase ROM  OT Short Term Goals - 10/03/19 1505      OT SHORT TERM GOAL #1   Title  Pt will be provided with and educated on HEP to improve mobility in RUE required for ADL completion.    Time  4    Period  Weeks    Status  On-going    Target Date  10/31/19      OT SHORT TERM GOAL #2   Title  Pt will increase RUE P/ROM to [facility] to improve ability to perform dressing tasks with minimal compensatory movements.    Time  4    Period  Weeks    Status  On-going      OT SHORT TERM GOAL #3   Title  Pt will increase RUE strength to 3+/5 to improve ability to perform grooming tasks such as washing face, and eating tasks using dominant RUE.    Time  4    Period  Weeks    Status  On-going        OT Long Term Goals - 10/03/19 1505      OT LONG TERM GOAL #1   Title  Pt will decrease RUE pain to 3/10 or less to improve ability to sleep in the bed for 4 consecutive hours or greater at night.    Time  8    Period  Weeks    Status  On-going      OT LONG TERM GOAL #2   Title  Pt will decrease fascial restrictions in RUE to trace amounts to  improve mobility required for functional reaching tasks.    Time  8    Period  Weeks    Status  On-going      OT LONG TERM GOAL #3   Title  Pt will increase A/ROM of RUE to [facility] to improve ability to reach overhead and behind back during dressing, bathing, etc.    Time  8    Period  Weeks    Status  On-going      OT LONG TERM GOAL #4   Title  Pt will increase RUE strength to 4+/5 or more to improve ability to perform lifting tasks during ADLs and at work as needed.    Time  8    Period  Weeks    Status  On-going      OT LONG TERM GOAL #5   Title  Pt will return to highest level of functioning during ADLs and work tasks using RUE as dominant.    Time  8    Period  Weeks    Status  On-going            Plan - 10/24/19 1259    Clinical Impression Statement  A: Pt reports improvement this week, he reports that he feels better when he comes to therapy more often as his shoulder doesn't feel as tight. Continued myofascial release to address fascial restrictions and improve ROM. Some progress made with P/ROM and A/ROM this date. Continued with AA/ROM with pulleys this date. Verbal cueing provided for form and technique.    Body Structure / Function / Physical Skills  ADL;Endurance;UE functional use;Fascial restriction;Flexibility;Pain;ROM;IADL;Strength    Plan  P: Continue shoulder stretches and cross chest stretch. Update HEP (smartphrase is .sstretch)    OT Home Exercise Plan  eval: table slides; 5/17: scapular A/ROM; 5/28: AA/ROM    Consulted and Agree with Plan of Care  Patient       Patient will benefit  from skilled therapeutic intervention in order to improve the following deficits and impairments:   Body Structure / Function / Physical Skills: ADL, Endurance, UE functional use, Fascial restriction, Flexibility, Pain, ROM, IADL, Strength       Visit Diagnosis: Acute pain of right shoulder  Stiffness of right shoulder, not elsewhere classified  Other symptoms and signs  involving the musculoskeletal system    Problem List Patient Active Problem List   Diagnosis Date Noted  . Need for influenza vaccination 05/15/2019  . Acute pain of right shoulder 05/15/2019  . Skin lesion 03/22/2018  . Situational phobia 11/29/2016  . Tension headache 07/05/2016  . Routine general medical examination at a health care facility 03/29/2016  . Smoker 03/29/2016  . Vaccine counseling 03/29/2016  . Generalized anxiety disorder 03/29/2016    Horris Latino, OTR/L 10/24/2019, 1:18 PM  [facility] [address] [place], [place], [address] Phone: [phone]   Fax:  [phone]  Name: Vincent Walter MRN: [id number] Date of Birth: Aug 15, 1975

## 2019-10-28 ENCOUNTER — Encounter (HOSPITAL_COMMUNITY): Payer: Self-pay | Admitting: Occupational Therapy

## 2019-10-28 ENCOUNTER — Ambulatory Visit (HOSPITAL_COMMUNITY): Payer: BC Managed Care – PPO | Admitting: Occupational Therapy

## 2019-10-28 ENCOUNTER — Other Ambulatory Visit: Payer: Self-pay

## 2019-10-28 DIAGNOSIS — R29898 Other symptoms and signs involving the musculoskeletal system: Secondary | ICD-10-CM

## 2019-10-28 DIAGNOSIS — M25611 Stiffness of right shoulder, not elsewhere classified: Secondary | ICD-10-CM | POA: Diagnosis not present

## 2019-10-28 DIAGNOSIS — M25511 Pain in right shoulder: Secondary | ICD-10-CM

## 2019-10-28 NOTE — Patient Instructions (Signed)
  1) Flexion Wall Stretch    Face wall, place affected handon wall in front of you. Slide hand up the wall  and lean body in towards the wall. Hold for 10 seconds. Repeat 3-5 times. 1-2 times/day.     2) Towel Stretch with Internal Rotation   Or     Gently pull up (or to the side) your affected arm  behind your back with the assist of a towel. Hold 10 seconds, repeat 3-5 times. 1-2 times/day.             3) Corner Stretch    Stand at a corner of a wall, place your arms on the walls with elbows bent. Lean into the corner until a stretch is felt along the front of your chest and/or shoulders. Hold for 10 seconds. Repeat 3-5X, 1-2 times/day.    4) Posterior Capsule Stretch    Bring the involved arm across chest. Grasp elbow and pull toward chest until you feel a stretch in the back of the upper arm and shoulder. Hold 10 seconds. Repeat 3-5X. Complete 1-2 times/day.    5) Scapular Retraction    Tuck chin back as you pinch shoulder blades together.  Hold 5 seconds. Repeat 3-5X. Complete 1-2 times/day.    6) External Rotation Stretch:     Place your affected hand on the wall with the elbow bent and gently turn your body the opposite direction until a stretch is felt. Hold 10 seconds, repeat 3-5X. Complete 1-2 times/day.     

## 2019-10-28 NOTE — Therapy (Signed)
[place] [facility] [address] [place], [place], [address] Phone: [phone]   Fax:  [phone]  Occupational Therapy Treatment  Patient Details  Name: Vincent Walter MRN: [id number] Date of Birth: 1976/01/30 Referring Provider (OT): Harriette Bouillon, [place]   Encounter Date: 10/28/2019  OT End of Session - 10/28/19 1113    Visit Number  8    Number of Visits  16    Date for OT Re-Evaluation  11/30/19    Authorization Type  BCBS    OT Start Time  [phone]    OT Stop Time  1027    OT Time Calculation (min)  43 min    Activity Tolerance  Patient tolerated treatment well    Behavior During Therapy  [facility] for tasks assessed/performed       Past Medical History:  Diagnosis Date  . Anxiety   . Chronic back pain   . Headache syndrome    related to anxiety  . Tobacco abuse   . Wears glasses     Past Surgical History:  Procedure Laterality Date  . MANDIBLE FRACTURE SURGERY      There were no vitals filed for this visit.  Subjective Assessment - 10/28/19 0945    Subjective   S: I slept in the bed last night.    Currently in Pain?  Yes    Pain Score  3     Pain Location  Shoulder    Pain Orientation  Right    Pain Descriptors / Indicators  Aching;Sore    Pain Type  Acute pain    Pain Radiating Towards  elbow    Pain Onset  More than a month ago    Pain Frequency  Intermittent    Aggravating Factors   movement, reaching    Pain Relieving Factors  rest    Effect of Pain on Daily Activities  mod effect on ADLs    Multiple Pain Sites  No         [facility] OT Assessment - 10/28/19 0944      Assessment   Medical Diagnosis  s/p right bicep tenodesis      Precautions   Precautions  Shoulder    Type of Shoulder Precautions  P/ROM, AA/ROM; ok to begin strengthening on 10/29/19               OT Treatments/Exercises (OP) - 10/28/19 0945      Exercises   Exercises  Shoulder      Shoulder Exercises: Supine   Protraction  PROM;5 reps     Horizontal ABduction  PROM;5 reps    External Rotation  PROM;5 reps    Internal Rotation  PROM;5 reps    Flexion  PROM;5 reps    ABduction  PROM;5 reps      Shoulder Exercises: Standing   Protraction  AAROM;12 reps    Horizontal ABduction  AAROM;12 reps    External Rotation  AAROM;12 reps    Internal Rotation  AAROM;12 reps    Flexion  AAROM;12 reps    ABduction  AAROM;12 reps      Shoulder Exercises: Pulleys   Flexion  1 minute    Scaption  1 minute      Shoulder Exercises: ROM/Strengthening   Wall Wash  1'      Shoulder Exercises: Stretch   Cross Chest Stretch  3 reps;20 seconds    Internal Rotation Stretch  3 reps   20" holds, horizontal towel   External Rotation  Stretch  3 reps;20 seconds    Wall Stretch - Flexion  3 reps;20 seconds    Wall Stretch - ABduction  3 reps;20 seconds    Other Shoulder Stretches  doorway stretch, 3x20"      Manual Therapy   Manual Therapy  Myofascial release;Muscle Energy Technique    Manual therapy comments  completed separately from therapeutic exercise    Myofascial Release  myofascial release and manual techniques completed to right upper arm, anterior shoulder, and trapezius regions to decrease pain and fascial restrictions and increase joint ROM.     Muscle Energy Technique  Muscle energy technique to right shoulder flexors and external rotators to increase ROM             OT Education - 10/28/19 1002    Education Details  shoulder stretches    Person(s) Educated  Patient    Methods  Explanation;Demonstration;Handout    Comprehension  Verbalized understanding;Returned demonstration       OT Short Term Goals - 10/03/19 1505      OT SHORT TERM GOAL #1   Title  Pt will be provided with and educated on HEP to improve mobility in RUE required for ADL completion.    Time  4    Period  Weeks    Status  On-going    Target Date  10/31/19      OT SHORT TERM GOAL #2   Title  Pt will increase RUE P/ROM to [facility] to improve  ability to perform dressing tasks with minimal compensatory movements.    Time  4    Period  Weeks    Status  On-going      OT SHORT TERM GOAL #3   Title  Pt will increase RUE strength to 3+/5 to improve ability to perform grooming tasks such as washing face, and eating tasks using dominant RUE.    Time  4    Period  Weeks    Status  On-going        OT Long Term Goals - 10/03/19 1505      OT LONG TERM GOAL #1   Title  Pt will decrease RUE pain to 3/10 or less to improve ability to sleep in the bed for 4 consecutive hours or greater at night.    Time  8    Period  Weeks    Status  On-going      OT LONG TERM GOAL #2   Title  Pt will decrease fascial restrictions in RUE to trace amounts to improve mobility required for functional reaching tasks.    Time  8    Period  Weeks    Status  On-going      OT LONG TERM GOAL #3   Title  Pt will increase A/ROM of RUE to [facility] to improve ability to reach overhead and behind back during dressing, bathing, etc.    Time  8    Period  Weeks    Status  On-going      OT LONG TERM GOAL #4   Title  Pt will increase RUE strength to 4+/5 or more to improve ability to perform lifting tasks during ADLs and at work as needed.    Time  8    Period  Weeks    Status  On-going      OT LONG TERM GOAL #5   Title  Pt will return to highest level of functioning during ADLs and work tasks using RUE as dominant.  Time  8    Period  Weeks    Status  On-going            Plan - 10/28/19 1003    Clinical Impression Statement  A: Pt reports he was able to sleep in the bed for the first time last night. Also believes he is improving his ROM during ADLs. Continued with manual techniques and passive stretching, AA/ROM in standing. Pt completed shoulder stretches and added to HEP. Also added wall wash. Verbal cuing for form and technique during exercises.    Body Structure / Function / Physical Skills  ADL;Endurance;UE functional use;Fascial  restriction;Flexibility;Pain;ROM;IADL;Strength    Plan  P: Mini-reassesment, FOTO. Follow up on HEP. Add finger ladder    OT Home Exercise Plan  eval: table slides; 5/17: scapular A/ROM; 5/28: AA/ROM    Consulted and Agree with Plan of Care  Patient       Patient will benefit from skilled therapeutic intervention in order to improve the following deficits and impairments:   Body Structure / Function / Physical Skills: ADL, Endurance, UE functional use, Fascial restriction, Flexibility, Pain, ROM, IADL, Strength       Visit Diagnosis: Acute pain of right shoulder  Stiffness of right shoulder, not elsewhere classified  Other symptoms and signs involving the musculoskeletal system    Problem List Patient Active Problem List   Diagnosis Date Noted  . Need for influenza vaccination 05/15/2019  . Acute pain of right shoulder 05/15/2019  . Skin lesion 03/22/2018  . Situational phobia 11/29/2016  . Tension headache 07/05/2016  . Routine general medical examination at a health care facility 03/29/2016  . Smoker 03/29/2016  . Vaccine counseling 03/29/2016  . Generalized anxiety disorder 03/29/2016   Guadelupe Sabin, OTR/L  [phone] 10/28/2019, 11:15 AM  [place] [address] [place], [place], [address] Phone: [phone]   Fax:  [phone]  Name: Vincent Walter MRN: [id number] Date of Birth: [birth year]

## 2019-10-30 ENCOUNTER — Ambulatory Visit (HOSPITAL_COMMUNITY): Payer: BC Managed Care – PPO | Admitting: Occupational Therapy

## 2019-10-30 ENCOUNTER — Other Ambulatory Visit: Payer: Self-pay

## 2019-10-30 ENCOUNTER — Encounter (HOSPITAL_COMMUNITY): Payer: Self-pay | Admitting: Occupational Therapy

## 2019-10-30 DIAGNOSIS — M25511 Pain in right shoulder: Secondary | ICD-10-CM | POA: Diagnosis not present

## 2019-10-30 DIAGNOSIS — M25611 Stiffness of right shoulder, not elsewhere classified: Secondary | ICD-10-CM | POA: Diagnosis not present

## 2019-10-30 DIAGNOSIS — R29898 Other symptoms and signs involving the musculoskeletal system: Secondary | ICD-10-CM | POA: Diagnosis not present

## 2019-10-30 NOTE — Therapy (Signed)
[place] [facility] [address] [place], [place], [address] Phone: [phone]   Fax:  [phone]  Occupational Therapy Treatment  Patient Details  Name: Vincent Walter MRN: [id number] Date of Birth: 04-18-1976 Referring Provider (OT): Harriette Bouillon, [place]   Encounter Date: 10/30/2019   OT End of Session - 10/30/19 1119    Visit Number 9    Number of Visits 16    Date for OT Re-Evaluation 11/30/19    Authorization Type BCBS    OT Start Time 0950    OT Stop Time 1030    OT Time Calculation (min) 40 min    Activity Tolerance Patient tolerated treatment well    Behavior During Therapy [facility] for tasks assessed/performed           Past Medical History:  Diagnosis Date  . Anxiety   . Chronic back pain   . Headache syndrome    related to anxiety  . Tobacco abuse   . Wears glasses     Past Surgical History:  Procedure Laterality Date  . MANDIBLE FRACTURE SURGERY      There were no vitals filed for this visit.   Subjective Assessment - 10/30/19 0951    Subjective  S: I've been busy and doing a lot of stuff.    Currently in Pain? Yes    Pain Score 4     Pain Location Shoulder    Pain Orientation Right    Pain Descriptors / Indicators Aching;Sore    Pain Type Acute pain    Pain Radiating Towards elbow    Pain Onset More than a month ago    Pain Frequency Intermittent    Aggravating Factors  movement, reaching    Pain Relieving Factors rest    Effect of Pain on Daily Activities mod effect on ADLs    Multiple Pain Sites No              [facility] OT Assessment - 10/30/19 0951      Assessment   Medical Diagnosis s/p right bicep tenodesis      Precautions   Precautions Shoulder    Type of Shoulder Precautions P/ROM, AA/ROM; ok to begin strengthening on 10/29/19                    OT Treatments/Exercises (OP) - 10/30/19 0952      Exercises   Exercises Shoulder      Shoulder Exercises: Supine   Protraction PROM;5  reps    Horizontal ABduction PROM;5 reps    External Rotation PROM;5 reps    Internal Rotation PROM;5 reps    Flexion PROM;5 reps    ABduction PROM;5 reps      Shoulder Exercises: Standing   Protraction AAROM;12 reps    Horizontal ABduction AAROM;12 reps    External Rotation AAROM;12 reps    Internal Rotation AAROM;12 reps    Flexion AAROM;12 reps    ABduction AAROM;12 reps    Extension Theraband;10 reps    Theraband Level (Shoulder Extension) Level 2 (Red)    Row Theraband;10 reps    Theraband Level (Shoulder Row) Level 2 (Red)    Retraction Theraband;10 reps    Theraband Level (Shoulder Retraction) Level 2 (Red)      Shoulder Exercises: ROM/Strengthening   Other ROM/Strengthening Exercises finger ladder to top rung, 5X      Shoulder Exercises: Stretch   Cross Chest Stretch 2 reps;30 seconds    Internal Rotation Stretch 2 reps  30" horizontal & vertical towel   External Rotation Stretch 2 reps;30 seconds    Wall Stretch - Flexion 2 reps;30 seconds    Wall Stretch - ABduction 2 reps;30 seconds      Functional Reaching Activities   High Level Pt placing 10 cones on top shelf of overhead cabinet in flexion, removing in abduction      Manual Therapy   Manual Therapy Myofascial release;Muscle Energy Technique    Manual therapy comments completed separately from therapeutic exercise    Myofascial Release myofascial release and manual techniques completed to right upper arm, anterior shoulder, and trapezius regions to decrease pain and fascial restrictions and increase joint ROM.     Muscle Energy Technique Muscle energy technique to right shoulder flexors and external rotators to increase ROM                    OT Short Term Goals - 10/03/19 1505      OT SHORT TERM GOAL #1   Title Pt will be provided with and educated on HEP to improve mobility in RUE required for ADL completion.    Time 4    Period Weeks    Status On-going    Target Date 10/31/19      OT  SHORT TERM GOAL #2   Title Pt will increase RUE P/ROM to [facility] to improve ability to perform dressing tasks with minimal compensatory movements.    Time 4    Period Weeks    Status On-going      OT SHORT TERM GOAL #3   Title Pt will increase RUE strength to 3+/5 to improve ability to perform grooming tasks such as washing face, and eating tasks using dominant RUE.    Time 4    Period Weeks    Status On-going             OT Long Term Goals - 10/03/19 1505      OT LONG TERM GOAL #1   Title Pt will decrease RUE pain to 3/10 or less to improve ability to sleep in the bed for 4 consecutive hours or greater at night.    Time 8    Period Weeks    Status On-going      OT LONG TERM GOAL #2   Title Pt will decrease fascial restrictions in RUE to trace amounts to improve mobility required for functional reaching tasks.    Time 8    Period Weeks    Status On-going      OT LONG TERM GOAL #3   Title Pt will increase A/ROM of RUE to [facility] to improve ability to reach overhead and behind back during dressing, bathing, etc.    Time 8    Period Weeks    Status On-going      OT LONG TERM GOAL #4   Title Pt will increase RUE strength to 4+/5 or more to improve ability to perform lifting tasks during ADLs and at work as needed.    Time 8    Period Weeks    Status On-going      OT LONG TERM GOAL #5   Title Pt will return to highest level of functioning during ADLs and work tasks using RUE as dominant.    Time 8    Period Weeks    Status On-going                 Plan - 10/30/19 1119    Clinical  Impression Statement A: Pt reports he has been using his arm a lot so it's a little more sore today. Pt with improvement in ROM and end range feel today. Continued with shoulder stretches increasing hold to 30" and switching IR stretch to vertical towel. Added finger ladder and scapular theraband today. Pt also completing functional reaching task. Verbal cuing for form and technique.    Body  Structure / Function / Physical Skills ADL;Endurance;UE functional use;Fascial restriction;Flexibility;Pain;ROM;IADL;Strength    Plan P: Mini-reassessment, FOTO, add scapular theraband to HEP    OT Home Exercise Plan eval: table slides; 5/17: scapular A/ROM; 5/28: AA/ROM    Consulted and Agree with Plan of Care Patient           Patient will benefit from skilled therapeutic intervention in order to improve the following deficits and impairments:   Body Structure / Function / Physical Skills: ADL, Endurance, UE functional use, Fascial restriction, Flexibility, Pain, ROM, IADL, Strength       Visit Diagnosis: Acute pain of right shoulder  Stiffness of right shoulder, not elsewhere classified  Other symptoms and signs involving the musculoskeletal system    Problem List Patient Active Problem List   Diagnosis Date Noted  . Need for influenza vaccination 05/15/2019  . Acute pain of right shoulder 05/15/2019  . Skin lesion 03/22/2018  . Situational phobia 11/29/2016  . Tension headache 07/05/2016  . Routine general medical examination at a health care facility 03/29/2016  . Smoker 03/29/2016  . Vaccine counseling 03/29/2016  . Generalized anxiety disorder 03/29/2016   Guadelupe Sabin, OTR/L  [phone] 10/30/2019, [place] [address] [place], [place], [address] Phone: [phone]   Fax:  [phone]  Name: Geary Rufo MRN: [id number] Date of Birth: 06-09-1975

## 2019-11-04 ENCOUNTER — Ambulatory Visit (HOSPITAL_COMMUNITY): Payer: BC Managed Care – PPO | Admitting: Occupational Therapy

## 2019-11-04 ENCOUNTER — Other Ambulatory Visit: Payer: Self-pay

## 2019-11-04 ENCOUNTER — Encounter (HOSPITAL_COMMUNITY): Payer: Self-pay | Admitting: Occupational Therapy

## 2019-11-04 DIAGNOSIS — M25611 Stiffness of right shoulder, not elsewhere classified: Secondary | ICD-10-CM | POA: Diagnosis not present

## 2019-11-04 DIAGNOSIS — R29898 Other symptoms and signs involving the musculoskeletal system: Secondary | ICD-10-CM | POA: Diagnosis not present

## 2019-11-04 DIAGNOSIS — M25511 Pain in right shoulder: Secondary | ICD-10-CM

## 2019-11-04 NOTE — Patient Instructions (Signed)

## 2019-11-04 NOTE — Therapy (Signed)
Vincent Walter, [place], [address] Phone: [phone]   Fax:  [phone]  Occupational Therapy Treatment (mini-reassessment)  Patient Details  Name: Vincent Walter MRN: [id number] Date of Birth: 09/28/1975 Referring Provider (OT): Gloriann Loan, [place]   Encounter Date: 11/04/2019   OT End of Session - 11/04/19 1103    Visit Number 10    Number of Visits 16    Date for OT Re-Evaluation 11/30/19    Authorization Type BCBS    OT Start Time 0945    OT Stop Time 1028    OT Time Calculation (min) 43 min    Activity Tolerance Patient tolerated treatment well    Behavior During Therapy [facility] for tasks assessed/performed           Past Medical History:  Diagnosis Date  . Anxiety   . Chronic back pain   . Headache syndrome    related to anxiety  . Tobacco abuse   . Wears glasses     Past Surgical History:  Procedure Laterality Date  . MANDIBLE FRACTURE SURGERY      There were no vitals filed for this visit.   Subjective Assessment - 11/04/19 0944    Subjective  S: I can feel it pulling in the back some.    Currently in Pain? Yes    Pain Score 3     Pain Location Shoulder    Pain Orientation Right    Pain Descriptors / Indicators Aching;Sore    Pain Type Acute pain    Pain Radiating Towards elbow    Pain Onset More than a month ago    Pain Frequency Intermittent    Aggravating Factors  movement, reaching    Pain Relieving Factors rest    Effect of Pain on Daily Activities mod effect on ADLs    Multiple Pain Sites No              [facility] OT Assessment - 11/04/19 0943      Assessment   Medical Diagnosis s/p right bicep tenodesis      Precautions   Precautions Shoulder    Type of Shoulder Precautions P/ROM, AA/ROM; ok to begin strengthening on 10/29/19      Observation/Other Assessments   Focus on Therapeutic Outcomes (FOTO)  59/100   32/100 previous     Palpation   Palpation comment Min fascial  restrictions in upper arm, trapezius, scapular regions      AROM   Overall AROM Comments Assessed seated, er/IR adducted    AROM Assessment Site Shoulder    Right/Left Shoulder Right    Right Shoulder Flexion 109 Degrees   57 previous   Right Shoulder ABduction 90 Degrees   62 previous   Right Shoulder Internal Rotation 90 Degrees   same as previous   Right Shoulder External Rotation 58 Degrees   26 previous     PROM   Overall PROM Comments Assessed supine, er/IR adducted    PROM Assessment Site Shoulder    Right/Left Shoulder Right    Right Shoulder Flexion 140 Degrees   97 previous   Right Shoulder ABduction 145 Degrees   75 previous   Right Shoulder Internal Rotation 90 Degrees   same as previous   Right Shoulder External Rotation 44 Degrees   12 previous     Strength   Overall Strength Comments Assessed seated, er/IR adducted    Strength Assessment Site Shoulder    Right/Left Shoulder Right  Right Shoulder Flexion 4/5   not previously assessed   Right Shoulder ABduction 3+/5   not previously assessed   Right Shoulder Internal Rotation 4+/5   not previously assessed   Right Shoulder External Rotation 4-/5   not previously assessed                   OT Treatments/Exercises (OP) - 11/04/19 0943      Exercises   Exercises Shoulder      Shoulder Exercises: Supine   Protraction PROM;5 reps;AROM;10 reps    Horizontal ABduction PROM;5 reps;AROM;10 reps    External Rotation PROM;5 reps;AAROM;10 reps    Internal Rotation PROM;5 reps;AAROM;10 reps    Flexion PROM;5 reps;AAROM;10 reps    ABduction PROM;5 reps;AROM;10 reps      Shoulder Exercises: Standing   Protraction AROM;10 reps    Horizontal ABduction AROM;10 reps    External Rotation AAROM;10 reps    Internal Rotation AAROM;10 reps    Flexion AAROM;10 reps    ABduction AAROM;10 reps    Extension Theraband;10 reps    Theraband Level (Shoulder Extension) Level 2 (Red)    Row Theraband;10 reps    Theraband  Level (Shoulder Row) Level 2 (Red)    Retraction Theraband;10 reps    Theraband Level (Shoulder Retraction) Level 2 (Red)      Shoulder Exercises: ROM/Strengthening   UBE (Upper Arm Bike) Level 1 2' forward 2' reverse   pace: 4.0   Proximal Shoulder Strengthening, Seated 10X each, no rest breaks      Shoulder Exercises: Stretch   External Rotation Stretch 1 rep;30 seconds    Wall Stretch - Flexion 1 rep;30 seconds      Manual Therapy   Manual Therapy Myofascial release;Muscle Energy Technique    Manual therapy comments completed separately from therapeutic exercise    Myofascial Release myofascial release and manual techniques completed to right upper arm, anterior shoulder, and trapezius regions to decrease pain and fascial restrictions and increase joint ROM.     Muscle Energy Technique Muscle energy technique to right shoulder flexors and external rotators to increase ROM                  OT Education - 11/04/19 1009    Education Details scapular theraband    Person(s) Educated Patient    Methods Explanation;Demonstration;Handout    Comprehension Verbalized understanding;Returned demonstration            OT Short Term Goals - 11/04/19 1103      OT SHORT TERM GOAL #1   Title Pt will be provided with and educated on HEP to improve mobility in RUE required for ADL completion.    Time 4    Period Weeks    Status On-going    Target Date 10/31/19      OT SHORT TERM GOAL #2   Title Pt will increase RUE P/ROM to [facility] to improve ability to perform dressing tasks with minimal compensatory movements.    Time 4    Period Weeks    Status On-going      OT SHORT TERM GOAL #3   Title Pt will increase RUE strength to 3+/5 to improve ability to perform grooming tasks such as washing face, and eating tasks using dominant RUE.    Time 4    Period Weeks    Status Achieved             OT Long Term Goals - 10/03/19 1505  OT LONG TERM GOAL #1   Title Pt will decrease  RUE pain to 3/10 or less to improve ability to sleep in the bed for 4 consecutive hours or greater at night.    Time 8    Period Weeks    Status On-going      OT LONG TERM GOAL #2   Title Pt will decrease fascial restrictions in RUE to trace amounts to improve mobility required for functional reaching tasks.    Time 8    Period Weeks    Status On-going      OT LONG TERM GOAL #3   Title Pt will increase A/ROM of RUE to [facility] to improve ability to reach overhead and behind back during dressing, bathing, etc.    Time 8    Period Weeks    Status On-going      OT LONG TERM GOAL #4   Title Pt will increase RUE strength to 4+/5 or more to improve ability to perform lifting tasks during ADLs and at work as needed.    Time 8    Period Weeks    Status On-going      OT LONG TERM GOAL #5   Title Pt will return to highest level of functioning during ADLs and work tasks using RUE as dominant.    Time 8    Period Weeks    Status On-going                 Plan - 11/04/19 1104    Clinical Impression Statement A: Mini-reassessment completed this session. Pt has made progress with ROM, strength, and functional use of RUE. Pt has met 1 goal and is progressing slowly towards remaining goals. Pt is primarily limited in overhead reaching and lifting due to ROM limitations secondary to adhesive capsulitis. Educated on importance of HEP completion, notably sustained stretching for ROM improvement. Continued with manual therapy and passive stretching today, pt with improving tolerance to ROM. Progressed to some A/ROM today, continued with AA/ROM when pt requiring increased assistance achieving full available ROM. Verbal cuing for form and technique.    Body Structure / Function / Physical Skills ADL;Endurance;UE functional use;Fascial restriction;Flexibility;Pain;ROM;IADL;Strength    Plan P: Continue progressing to A/ROM as able, add overhead lacing and ball on wall, follow up on HEP    OT Home Exercise  Plan eval: table slides; 5/17: scapular A/ROM; 5/28: AA/ROM; 6/15: scapular theraband    Consulted and Agree with Plan of Care Patient           Patient will benefit from skilled therapeutic intervention in order to improve the following deficits and impairments:   Body Structure / Function / Physical Skills: ADL, Endurance, UE functional use, Fascial restriction, Flexibility, Pain, ROM, IADL, Strength       Visit Diagnosis: Acute pain of right shoulder  Stiffness of right shoulder, not elsewhere classified  Other symptoms and signs involving the musculoskeletal system    Problem List Patient Active Problem List   Diagnosis Date Noted  . Need for influenza vaccination 05/15/2019  . Acute pain of right shoulder 05/15/2019  . Skin lesion 03/22/2018  . Situational phobia 11/29/2016  . Tension headache 07/05/2016  . Routine general medical examination at a health care facility 03/29/2016  . Smoker 03/29/2016  . Vaccine counseling 03/29/2016  . Generalized anxiety disorder 03/29/2016    Guadelupe Sabin, OTR/L  [phone] 11/04/2019, 11:07 AM  [place], [place],  [place] Phone: [phone]   Fax:  [phone]  Name: Paulo Keimig MRN: [id number] Date of Birth: 12-08-1975

## 2019-11-06 ENCOUNTER — Encounter (HOSPITAL_COMMUNITY): Payer: Self-pay | Admitting: Occupational Therapy

## 2019-11-06 ENCOUNTER — Ambulatory Visit (HOSPITAL_COMMUNITY): Payer: BC Managed Care – PPO | Admitting: Occupational Therapy

## 2019-11-06 ENCOUNTER — Other Ambulatory Visit: Payer: Self-pay

## 2019-11-06 DIAGNOSIS — M25611 Stiffness of right shoulder, not elsewhere classified: Secondary | ICD-10-CM

## 2019-11-06 DIAGNOSIS — M25511 Pain in right shoulder: Secondary | ICD-10-CM

## 2019-11-06 DIAGNOSIS — R29898 Other symptoms and signs involving the musculoskeletal system: Secondary | ICD-10-CM | POA: Diagnosis not present

## 2019-11-06 NOTE — Therapy (Signed)
[facility] [address] [place], [place], [address] Phone: [phone]   Fax:  [phone]  Occupational Therapy Treatment  Patient Details  Name: Vincent Walter MRN: [id number] Date of Birth: [birth year] Referring Provider (OT): Harriette Bouillon, [place]   Encounter Date: 11/06/2019   OT End of Session - 11/06/19 1415    Visit Number 11    Number of Visits 16    Date for OT Re-Evaluation 11/30/19    Authorization Type BCBS    OT Start Time 0945    OT Stop Time 1030    OT Time Calculation (min) 45 min    Activity Tolerance Patient tolerated treatment well    Behavior During Therapy [facility] for tasks assessed/performed           Past Medical History:  Diagnosis Date  . Anxiety   . Chronic back pain   . Headache syndrome    related to anxiety  . Tobacco abuse   . Wears glasses     Past Surgical History:  Procedure Laterality Date  . MANDIBLE FRACTURE SURGERY      There were no vitals filed for this visit.   Subjective Assessment - 11/06/19 0948    Subjective  S: I feel like I have made good progress    Pertinent History Pt is a 44 y/o male s/p bicep tenodesis on 09/15/19 with additional findings of frozen shoulder. Pt presents without sling, removed last week per MD orders. Pt was referred to occupational therapy for evaluation and treatment by Harriette Bouillon, PA-C; surgeon was Dr. Cammy Copa.    Currently in Pain? Yes    Pain Score 2     Pain Location Shoulder    Pain Orientation Right    Pain Descriptors / Indicators Aching;Sore                        OT Treatments/Exercises (OP) - 11/06/19 1002      Exercises   Exercises Shoulder      Shoulder Exercises: Supine   Protraction PROM;5 reps    Horizontal ABduction PROM;5 reps    External Rotation PROM;5 reps    Internal Rotation PROM;5 reps    Flexion PROM;5 reps    ABduction PROM;5 reps      Shoulder Exercises: Standing   Flexion AAROM;12 reps     ABduction AAROM;10 reps    Extension Theraband;10 reps    Theraband Level (Shoulder Extension) Level 2 (Red)    Row AAROM;12 reps;Strengthening;10 reps;Theraband    Theraband Level (Shoulder Row) Level 2 (Red)    Retraction Theraband;10 reps    Theraband Level (Shoulder Retraction) Level 2 (Red)      Shoulder Exercises: ROM/Strengthening   UBE (Upper Arm Bike) Level 1 2' forward 2' reverse    Wall Wash 10x flexion. 10x abduction    Over Head Lace 1' to put in; 1' to take out    "W" Arms 10x    [place] P[place] on Wall 1' with wash clothe; 1' with ball      Shoulder Exercises: Stretch   Internal Rotation Stretch 30 seconds    10" horizontal towel   Wall Stretch - Flexion 30 seconds;3 reps    Wall Stretch - ABduction 3 reps;30 seconds      Manual Therapy   Manual Therapy Myofascial release    Manual therapy comments completed separately from therapeutic exercise    Myofascial Release myofascial release and manual techniques completed to  right upper arm, anterior shoulder, and trapezius regions to decrease pain and fascial restrictions and increase joint ROM.                     OT Short Term Goals - 11/04/19 1103      OT SHORT TERM GOAL #1   Title Pt will be provided with and educated on HEP to improve mobility in RUE required for ADL completion.    Time 4    Period Weeks    Status On-going    Target Date 10/31/19      OT SHORT TERM GOAL #2   Title Pt will increase RUE P/ROM to [facility] to improve ability to perform dressing tasks with minimal compensatory movements.    Time 4    Period Weeks    Status On-going      OT SHORT TERM GOAL #3   Title Pt will increase RUE strength to 3+/5 to improve ability to perform grooming tasks such as washing face, and eating tasks using dominant RUE.    Time 4    Period Weeks    Status Achieved             OT Long Term Goals - 10/03/19 1505      OT LONG TERM GOAL #1   Title Pt will decrease RUE pain to 3/10 or less to improve  ability to sleep in the bed for 4 consecutive hours or greater at night.    Time 8    Period Weeks    Status On-going      OT LONG TERM GOAL #2   Title Pt will decrease fascial restrictions in RUE to trace amounts to improve mobility required for functional reaching tasks.    Time 8    Period Weeks    Status On-going      OT LONG TERM GOAL #3   Title Pt will increase A/ROM of RUE to [facility] to improve ability to reach overhead and behind back during dressing, bathing, etc.    Time 8    Period Weeks    Status On-going      OT LONG TERM GOAL #4   Title Pt will increase RUE strength to 4+/5 or more to improve ability to perform lifting tasks during ADLs and at work as needed.    Time 8    Period Weeks    Status On-going      OT LONG TERM GOAL #5   Title Pt will return to highest level of functioning during ADLs and work tasks using RUE as dominant.    Time 8    Period Weeks    Status On-going                 Plan - 11/06/19 1443    Clinical Impression Statement A:Continued with manual therapy and passive stretching. Continued AAROM, AROM, and strengthening exercises through wall wash, overhead lacing, theraband, and UE bike. Verbal cuing for form and technique.    Body Structure / Function / Physical Skills ADL;Endurance;UE functional use;Fascial restriction;Flexibility;Pain;ROM;IADL;Strength    Plan P: Continue progressing to A/ROM as able. Follow up HEP.    OT Home Exercise Plan eval: table slides; 5/17: scapular A/ROM; 5/28: AA/ROM; 6/15: scapular theraband    Consulted and Agree with Plan of Care Patient           Patient will benefit from skilled therapeutic intervention in order to improve the following deficits and impairments:   Body Structure /  Function / Physical Skills: ADL, Endurance, UE functional use, Fascial restriction, Flexibility, Pain, ROM, IADL, Strength       Visit Diagnosis: Acute pain of right shoulder  Stiffness of right shoulder, not  elsewhere classified  Other symptoms and signs involving the musculoskeletal system    Problem List Patient Active Problem List   Diagnosis Date Noted  . Need for influenza vaccination 05/15/2019  . Acute pain of right shoulder 05/15/2019  . Skin lesion 03/22/2018  . Situational phobia 11/29/2016  . Tension headache 07/05/2016  . Routine general medical examination at a health care facility 03/29/2016  . Smoker 03/29/2016  . Vaccine counseling 03/29/2016  . Generalized anxiety disorder 03/29/2016    Neal Dy, MSOT, OTR/L 11/06/2019, 2:47 PM  [facility] [address] [place], [place], [address] Phone: [phone]   Fax:  [phone]  Name: Vincent Walter MRN: [id number] Date of Birth: 10/28/1975

## 2019-11-12 ENCOUNTER — Encounter (HOSPITAL_COMMUNITY): Payer: Self-pay | Admitting: Occupational Therapy

## 2019-11-12 ENCOUNTER — Ambulatory Visit (HOSPITAL_COMMUNITY): Payer: BC Managed Care – PPO | Admitting: Occupational Therapy

## 2019-11-12 ENCOUNTER — Other Ambulatory Visit: Payer: Self-pay | Admitting: Medical

## 2019-11-12 ENCOUNTER — Other Ambulatory Visit: Payer: Self-pay

## 2019-11-12 ENCOUNTER — Ambulatory Visit (INDEPENDENT_AMBULATORY_CARE_PROVIDER_SITE_OTHER): Payer: BC Managed Care – PPO | Admitting: Orthopedic Surgery

## 2019-11-12 DIAGNOSIS — M25511 Pain in right shoulder: Secondary | ICD-10-CM | POA: Diagnosis not present

## 2019-11-12 DIAGNOSIS — M7501 Adhesive capsulitis of right shoulder: Secondary | ICD-10-CM

## 2019-11-12 DIAGNOSIS — M25611 Stiffness of right shoulder, not elsewhere classified: Secondary | ICD-10-CM

## 2019-11-12 DIAGNOSIS — M67813 Other specified disorders of tendon, right shoulder: Secondary | ICD-10-CM

## 2019-11-12 DIAGNOSIS — R29898 Other symptoms and signs involving the musculoskeletal system: Secondary | ICD-10-CM

## 2019-11-12 NOTE — Therapy (Signed)
[facility] [address] [place], [place], [address] Phone: [phone]   Fax:  [phone]  Occupational Therapy Treatment  Patient Details  Name: Vincent Walter MRN: [id number] Date of Birth: Apr 12, 1976 Referring Provider (OT): Harriette Bouillon, [place]   Encounter Date: 11/12/2019   OT End of Session - 11/12/19 0936    Visit Number 12    Number of Visits 16    Date for OT Re-Evaluation 11/30/19    Authorization Type BCBS    OT Start Time 0904    OT Stop Time 0942    OT Time Calculation (min) 38 min    Activity Tolerance Patient tolerated treatment well    Behavior During Therapy [facility] for tasks assessed/performed           Past Medical History:  Diagnosis Date  . Anxiety   . Chronic back pain   . Headache syndrome    related to anxiety  . Tobacco abuse   . Wears glasses     Past Surgical History:  Procedure Laterality Date  . MANDIBLE FRACTURE SURGERY      There were no vitals filed for this visit.   Subjective Assessment - 11/12/19 0906    Subjective  S: I weedeated on Friday.    Currently in Pain? Yes    Pain Score 2     Pain Location Shoulder    Pain Orientation Right    Pain Descriptors / Indicators Aching;Sore    Pain Type Acute pain    Pain Radiating Towards elbow    Pain Onset More than a month ago    Pain Frequency Intermittent    Aggravating Factors  movement, reaching    Pain Relieving Factors rest    Effect of Pain on Daily Activities mod effect on ADLs    Multiple Pain Sites No              [facility] OT Assessment - 11/12/19 0906      Assessment   Medical Diagnosis s/p right bicep tenodesis      Precautions   Precautions Shoulder    Type of Shoulder Precautions P/ROM, AA/ROM; ok to begin strengthening on 10/29/19                    OT Treatments/Exercises (OP) - 11/12/19 0907      Exercises   Exercises Shoulder      Shoulder Exercises: Supine   Protraction PROM;5 reps     Horizontal ABduction PROM;5 reps    External Rotation PROM;5 reps    Internal Rotation PROM;5 reps    Flexion PROM;5 reps    ABduction PROM;5 reps      Shoulder Exercises: Standing   Protraction AROM;10 reps    Horizontal ABduction AROM;10 reps    External Rotation AAROM;10 reps    Internal Rotation AAROM;10 reps    Flexion AROM;10 reps    ABduction AROM;10 reps    Extension Theraband;15 reps    Theraband Level (Shoulder Extension) Level 2 (Red)    Row Theraband;15 reps    Theraband Level (Shoulder Row) Level 2 (Red)    Retraction Theraband;15 reps    Theraband Level (Shoulder Retraction) Level 2 (Red)      Shoulder Exercises: ROM/Strengthening   X to V Arms 10X each    Proximal Shoulder Strengthening, Seated 10X each, no rest breaks    Ball on Wall 1' flexion 1' abduction    Other ROM/Strengthening Exercises Ball pass behind back for  IR, behind head for er, 10X each      Shoulder Exercises: Stretch   Internal Rotation Stretch 2 reps   30"; vertical towel   External Rotation Stretch 2 reps;30 seconds    Wall Stretch - Flexion 2 reps;30 seconds    Wall Stretch - ABduction 2 reps;30 seconds      Manual Therapy   Manual Therapy Myofascial release    Manual therapy comments completed separately from therapeutic exercise    Myofascial Release myofascial release and manual techniques completed to right upper arm, anterior shoulder, and trapezius regions to decrease pain and fascial restrictions and increase joint ROM.                     OT Short Term Goals - 11/04/19 1103      OT SHORT TERM GOAL #1   Title Pt will be provided with and educated on HEP to improve mobility in RUE required for ADL completion.    Time 4    Period Weeks    Status On-going    Target Date 10/31/19      OT SHORT TERM GOAL #2   Title Pt will increase RUE P/ROM to [facility] to improve ability to perform dressing tasks with minimal compensatory movements.    Time 4    Period Weeks    Status  On-going      OT SHORT TERM GOAL #3   Title Pt will increase RUE strength to 3+/5 to improve ability to perform grooming tasks such as washing face, and eating tasks using dominant RUE.    Time 4    Period Weeks    Status Achieved             OT Long Term Goals - 10/03/19 1505      OT LONG TERM GOAL #1   Title Pt will decrease RUE pain to 3/10 or less to improve ability to sleep in the bed for 4 consecutive hours or greater at night.    Time 8    Period Weeks    Status On-going      OT LONG TERM GOAL #2   Title Pt will decrease fascial restrictions in RUE to trace amounts to improve mobility required for functional reaching tasks.    Time 8    Period Weeks    Status On-going      OT LONG TERM GOAL #3   Title Pt will increase A/ROM of RUE to [facility] to improve ability to reach overhead and behind back during dressing, bathing, etc.    Time 8    Period Weeks    Status On-going      OT LONG TERM GOAL #4   Title Pt will increase RUE strength to 4+/5 or more to improve ability to perform lifting tasks during ADLs and at work as needed.    Time 8    Period Weeks    Status On-going      OT LONG TERM GOAL #5   Title Pt will return to highest level of functioning during ADLs and work tasks using RUE as dominant.    Time 8    Period Weeks    Status On-going                 Plan - 11/12/19 0919    Clinical Impression Statement A: Pt reports he is having some popping in his shoulder, no lingering pain associated. Continued with manual techniques to address fascial restrictions, improvement  noted in muscle knot on scapula. Progressed to all A/ROM in standing today, pt able to achieve 75% ROM with minimal trapezius compensation. Continued with shoulder stretches, added ball pass for er/IR and continued with scapular theraband exercises. Also completing x to v arms and ball on the wall for scapular strengthening. Verbal cuing for form and technique.    Body Structure / Function /  Physical Skills ADL;Endurance;UE functional use;Fascial restriction;Flexibility;Pain;ROM;IADL;Strength    Plan P: Follow up on MD appt; continue with A/ROM and attempt therapy ball strengthening    OT Home Exercise Plan eval: table slides; 5/17: scapular A/ROM; 5/28: AA/ROM; 6/15: scapular theraband    Consulted and Agree with Plan of Care Patient           Patient will benefit from skilled therapeutic intervention in order to improve the following deficits and impairments:   Body Structure / Function / Physical Skills: ADL, Endurance, UE functional use, Fascial restriction, Flexibility, Pain, ROM, IADL, Strength       Visit Diagnosis: Acute pain of right shoulder  Stiffness of right shoulder, not elsewhere classified  Other symptoms and signs involving the musculoskeletal system    Problem List Patient Active Problem List   Diagnosis Date Noted  . Need for influenza vaccination 05/15/2019  . Acute pain of right shoulder 05/15/2019  . Skin lesion 03/22/2018  . Situational phobia 11/29/2016  . Tension headache 07/05/2016  . Routine general medical examination at a health care facility 03/29/2016  . Smoker 03/29/2016  . Vaccine counseling 03/29/2016  . Generalized anxiety disorder 03/29/2016   Guadelupe Sabin, OTR/L  [phone] 11/12/2019, 9:46 AM  [facility] [address] [place], [place], [address] Phone: [phone]   Fax:  [phone]  Name: Vincent Walter MRN: [id number] Date of Birth: [birth year]

## 2019-11-14 ENCOUNTER — Telehealth: Payer: Self-pay | Admitting: Orthopedic Surgery

## 2019-11-14 ENCOUNTER — Ambulatory Visit: Payer: BC Managed Care – PPO | Admitting: Family Medicine

## 2019-11-14 ENCOUNTER — Other Ambulatory Visit: Payer: Self-pay

## 2019-11-14 ENCOUNTER — Encounter: Payer: Self-pay | Admitting: Family Medicine

## 2019-11-14 ENCOUNTER — Encounter (HOSPITAL_COMMUNITY): Payer: Self-pay | Admitting: Occupational Therapy

## 2019-11-14 ENCOUNTER — Ambulatory Visit (HOSPITAL_COMMUNITY): Payer: BC Managed Care – PPO | Admitting: Occupational Therapy

## 2019-11-14 ENCOUNTER — Ambulatory Visit: Payer: Self-pay

## 2019-11-14 DIAGNOSIS — M25511 Pain in right shoulder: Secondary | ICD-10-CM | POA: Diagnosis not present

## 2019-11-14 DIAGNOSIS — R29898 Other symptoms and signs involving the musculoskeletal system: Secondary | ICD-10-CM

## 2019-11-14 DIAGNOSIS — M67813 Other specified disorders of tendon, right shoulder: Secondary | ICD-10-CM

## 2019-11-14 DIAGNOSIS — M25611 Stiffness of right shoulder, not elsewhere classified: Secondary | ICD-10-CM | POA: Diagnosis not present

## 2019-11-14 MED ORDER — OXYCODONE HCL 5 MG PO TABS: 5.0000 mg | ORAL_TABLET | Freq: Four times a day (QID) | ORAL | 0 refills | Status: DC | PRN

## 2019-11-14 NOTE — Therapy (Signed)
[facility] [address] [place], [place], [address] Phone: [phone]   Fax:  [phone]  Occupational Therapy Treatment  Patient Details  Name: Vincent Walter MRN: [id number] Date of Birth: Aug 13, 1975 Referring Provider (OT): Harriette Bouillon, [place]   Encounter Date: 11/14/2019   OT End of Session - 11/14/19 1029    Visit Number 13    Number of Visits 16    Date for OT Re-Evaluation 11/30/19    Authorization Type BCBS    OT Start Time [phone]   pt arrived late   OT Stop Time 1029    OT Time Calculation (min) 36 min    Activity Tolerance Patient tolerated treatment well    Behavior During Therapy [facility] for tasks assessed/performed           Past Medical History:  Diagnosis Date  . Anxiety   . Chronic back pain   . Headache syndrome    related to anxiety  . Tobacco abuse   . Wears glasses     Past Surgical History:  Procedure Laterality Date  . MANDIBLE FRACTURE SURGERY      There were no vitals filed for this visit.   Subjective Assessment - 11/14/19 0954    Subjective  S: I'm getting a steroid shot today.    Currently in Pain? Yes    Pain Score 2     Pain Location Shoulder    Pain Orientation Right    Pain Descriptors / Indicators Aching;Sore    Pain Type Acute pain    Pain Radiating Towards elbow    Pain Onset More than a month ago    Pain Frequency Intermittent    Aggravating Factors  movement, reaching    Pain Relieving Factors rest    Effect of Pain on Daily Activities min effect on ADLs    Multiple Pain Sites No              [facility] OT Assessment - 11/14/19 0953      Assessment   Medical Diagnosis s/p right bicep tenodesis      Precautions   Precautions Shoulder    Type of Shoulder Precautions progress as tolerated                    OT Treatments/Exercises (OP) - 11/14/19 0954      Exercises   Exercises Shoulder      Shoulder Exercises: Supine   Protraction PROM;5 reps     Horizontal ABduction PROM;5 reps    External Rotation PROM;5 reps    Internal Rotation PROM;5 reps    Flexion PROM;5 reps    ABduction PROM;5 reps      Shoulder Exercises: Standing   Protraction AROM;12 reps    Horizontal ABduction AROM;12 reps    External Rotation AROM;12 reps    Internal Rotation AROM;12 reps    Flexion AROM;12 reps    ABduction AROM;12 reps      Shoulder Exercises: Therapy Ball   Other Therapy Ball Exercises green therapy ball: chest press, flexion, circles each direction 10X each       Shoulder Exercises: ROM/Strengthening   UBE (Upper Arm Bike) Level 2 3' reverse   pace: 4.0   X to V Arms 10X each    Proximal Shoulder Strengthening, Seated 10X each, no rest breaks    Ball on Wall 1' flexion 1' abduction    Other ROM/Strengthening Exercises Ball pass behind back for IR, behind head for er, 10X  each    Other ROM/Strengthening Exercises Y lift off, 10X      Shoulder Exercises: Stretch   Internal Rotation Stretch 2 reps   30" vertical towel   External Rotation Stretch 2 reps;30 seconds    Wall Stretch - Flexion 2 reps;30 seconds    Wall Stretch - ABduction 2 reps;30 seconds                                     OT Short Term Goals - 11/04/19 1103      OT SHORT TERM GOAL #1   Title Pt will be provided with and educated on HEP to improve mobility in RUE required for ADL completion.    Time 4    Period Weeks    Status On-going    Target Date 10/31/19      OT SHORT TERM GOAL #2   Title Pt will increase RUE P/ROM to [facility] to improve ability to perform dressing tasks with minimal compensatory movements.    Time 4    Period Weeks    Status On-going      OT SHORT TERM GOAL #3   Title Pt will increase RUE strength to 3+/5 to improve ability to perform grooming tasks such as washing face, and eating tasks using dominant RUE.    Time 4    Period Weeks    Status Achieved             OT Long Term Goals - 10/03/19 1505      OT LONG TERM GOAL  #1   Title Pt will decrease RUE pain to 3/10 or less to improve ability to sleep in the bed for 4 consecutive hours or greater at night.    Time 8    Period Weeks    Status On-going      OT LONG TERM GOAL #2   Title Pt will decrease fascial restrictions in RUE to trace amounts to improve mobility required for functional reaching tasks.    Time 8    Period Weeks    Status On-going      OT LONG TERM GOAL #3   Title Pt will increase A/ROM of RUE to [facility] to improve ability to reach overhead and behind back during dressing, bathing, etc.    Time 8    Period Weeks    Status On-going      OT LONG TERM GOAL #4   Title Pt will increase RUE strength to 4+/5 or more to improve ability to perform lifting tasks during ADLs and at work as needed.    Time 8    Period Weeks    Status On-going      OT LONG TERM GOAL #5   Title Pt will return to highest level of functioning during ADLs and work tasks using RUE as dominant.    Time 8    Period Weeks    Status On-going                 Plan - 11/14/19 1010    Clinical Impression Statement A: Pt reports MD appt went well, he is going back for a cortisone shot today. Pt reports tricep pain and spasms last night. Pt completed A/ROM, shoulder stretches, and scapular stability tasks today. Added Y lift off and therapy ball strengthening today. Verbal cuing for form and technique.    Body Structure / Function / Physical  Skills ADL;Endurance;UE functional use;Fascial restriction;Flexibility;Pain;ROM;IADL;Strength    Plan P: Continue with therapy ball strengthening, add red loop band wall slide with lift off and lateral wall slides    OT Home Exercise Plan eval: table slides; 5/17: scapular A/ROM; 5/28: AA/ROM; 6/15: scapular theraband    Consulted and Agree with Plan of Care Patient           Patient will benefit from skilled therapeutic intervention in order to improve the following deficits and impairments:   Body Structure / Function /  Physical Skills: ADL, Endurance, UE functional use, Fascial restriction, Flexibility, Pain, ROM, IADL, Strength       Visit Diagnosis: Acute pain of right shoulder  Stiffness of right shoulder, not elsewhere classified  Other symptoms and signs involving the musculoskeletal system    Problem List Patient Active Problem List   Diagnosis Date Noted  . Need for influenza vaccination 05/15/2019  . Acute pain of right shoulder 05/15/2019  . Skin lesion 03/22/2018  . Situational phobia 11/29/2016  . Tension headache 07/05/2016  . Routine general medical examination at a health care facility 03/29/2016  . Smoker 03/29/2016  . Vaccine counseling 03/29/2016  . Generalized anxiety disorder 03/29/2016   Guadelupe Sabin, OTR/L  [phone] 11/14/2019, 10:30 AM  [facility], [place], [address] Phone: [phone]   Fax:  [phone]  Name: Jill Ruppe MRN: [id number] Date of Birth: 25-Apr-1976

## 2019-11-14 NOTE — Telephone Encounter (Signed)
Patient called asked if his return to work note can be amended. Patient said the note should state he will return to work 11/24/2019.  Patient said he is coming in today to see Dr. Prince Rome and can pick up the note. The number to contact patient is [phone]

## 2019-11-14 NOTE — Progress Notes (Signed)
Subjective: Patient is here for ultrasound-guided intra-articular right glenohumeral injection.  He has adhesive capsulitis status post labrum repair.  Objective: Decreased range of motion with abduction, internal and external rotation.  Procedure: Ultrasound-guided right glenohumeral injection: After sterile prep with Betadine, injected 8 cc 1% lidocaine without epinephrine and 40 mg methylprednisolone using a 22-gauge spinal needle, passing the needle from posterior approach into the glenohumeral joint.  Injectate seen filling the joint capsule.  Follow-up as directed.

## 2019-11-14 NOTE — Telephone Encounter (Signed)
Note corrected and put up front.

## 2019-11-15 ENCOUNTER — Encounter: Payer: Self-pay | Admitting: Orthopedic Surgery

## 2019-11-15 NOTE — Progress Notes (Signed)
Post-Op Visit Note   Patient: Vincent Walter           Date of Birth: 08-17-1975           MRN: [id number] Visit Date: 11/12/2019 PCP: Carlena Hurl, PA-C   Assessment & Plan:  Chief Complaint:  Chief Complaint  Patient presents with  . Right Shoulder - Follow-up   Visit Diagnoses:  1. Biceps tendonosis of right shoulder   2. Adhesive capsulitis of right shoulder     Plan: Patient is a 44 year old male who presents s/p right shoulder biceps tenodesis with manipulation under anesthesia on 09/15/2019.  Patient notes he is doing well overall and continues to go to physical therapy.  His range of motion has improved to 25 degrees external rotation, 80 degrees abduction, 120 degrees forward flexion.  He has excellent strength of the bicep.  Incision has healed well.  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  Overall he is progressing well but does have some limited range of motion from the frozen shoulder that is persisting.  Plan for patient to follow-up on Friday for a right shoulder glenohumeral injection with Dr. Junius Roads.  Continue with physical therapy.  Work note provided for return to work.  Follow-up in 8 weeks for clinical recheck.  Follow-Up Instructions: No follow-ups on file.   Orders:  No orders of the defined types were placed in this encounter.  No orders of the defined types were placed in this encounter.   Imaging: US Guided Needle Placement - No Linked Charges  Result Date: 11/14/2019 Ultrasound-guided right glenohumeral injection: After sterile prep with Betadine, injected 8 cc 1% lidocaine without epinephrine and 40 mg methylprednisolone using a 22-gauge spinal needle, passing the needle from posterior approach into the glenohumeral joint.  Injectate seen filling the joint capsule.  Follow-up as directed.   PMFS History: Patient Active Problem List   Diagnosis Date Noted  . Need for  influenza vaccination 05/15/2019  . Acute pain of right shoulder 05/15/2019  . Skin lesion 03/22/2018  . Situational phobia 11/29/2016  . Tension headache 07/05/2016  . Routine general medical examination at a health care facility 03/29/2016  . Smoker 03/29/2016  . Vaccine counseling 03/29/2016  . Generalized anxiety disorder 03/29/2016   Past Medical History:  Diagnosis Date  . Anxiety   . Chronic back pain   . Headache syndrome    related to anxiety  . Tobacco abuse   . Wears glasses     Family History  Problem Relation Age of Onset  . Alcohol abuse Mother   . Hypertension Father   . Cancer Father        skin  . Hyperlipidemia Brother   . Hypertension Brother   . Anxiety disorder Daughter   . Cancer Paternal Grandfather   . Heart disease Neg Hx   . Other Neg Hx   . Stroke Neg Hx   . Diabetes Neg Hx     Past Surgical History:  Procedure Laterality Date  . MANDIBLE FRACTURE SURGERY     Social History   Occupational History  . Occupation: Librarian, academic  Tobacco Use  . Smoking status: Current Every Day Smoker    Packs/day: 1.00    Years: 27.00    Pack years: 27.00    Types: Cigarettes  . Smokeless tobacco: Current User  Vaping Use  . Vaping Use: Never used  Substance and Sexual  Activity  . Alcohol use: Yes    Alcohol/week: 7.0 standard drinks    Types: 7 Cans of beer per week    Comment: occasional  . Drug use: No  . Sexual activity: Not on file

## 2019-11-18 ENCOUNTER — Other Ambulatory Visit: Payer: Self-pay

## 2019-11-18 ENCOUNTER — Encounter (HOSPITAL_COMMUNITY): Payer: Self-pay | Admitting: Occupational Therapy

## 2019-11-18 ENCOUNTER — Ambulatory Visit (HOSPITAL_COMMUNITY): Payer: BC Managed Care – PPO | Admitting: Occupational Therapy

## 2019-11-18 DIAGNOSIS — M25511 Pain in right shoulder: Secondary | ICD-10-CM

## 2019-11-18 DIAGNOSIS — M25611 Stiffness of right shoulder, not elsewhere classified: Secondary | ICD-10-CM

## 2019-11-18 DIAGNOSIS — R29898 Other symptoms and signs involving the musculoskeletal system: Secondary | ICD-10-CM | POA: Diagnosis not present

## 2019-11-18 NOTE — Therapy (Signed)
[facility] [address] [place], [place], [address] Phone: [phone]   Fax:  [phone]  Occupational Therapy Treatment  Patient Details  Name: Vincent Walter MRN: [id number] Date of Birth: 1975-08-25 Referring Provider (OT): Harriette Bouillon, [place]   Encounter Date: 11/18/2019   OT End of Session - 11/18/19 1152    Visit Number 14    Number of Visits 16    Date for OT Re-Evaluation 11/30/19    Authorization Type BCBS    OT Start Time 1113    OT Stop Time 1154    OT Time Calculation (min) 41 min    Activity Tolerance Patient tolerated treatment well    Behavior During Therapy [facility] for tasks assessed/performed           Past Medical History:  Diagnosis Date  . Anxiety   . Chronic back pain   . Headache syndrome    related to anxiety  . Tobacco abuse   . Wears glasses     Past Surgical History:  Procedure Laterality Date  . MANDIBLE FRACTURE SURGERY      There were no vitals filed for this visit.   Subjective Assessment - 11/18/19 1112    Subjective  S:I got the shot Friday and that night I could move it more.    Currently in Pain? No/denies              [facility] OT Assessment - 11/18/19 1111      Assessment   Medical Diagnosis s/p right bicep tenodesis      Precautions   Precautions Shoulder    Type of Shoulder Precautions progress as tolerated                    OT Treatments/Exercises (OP) - 11/18/19 1114      Exercises   Exercises Shoulder      Shoulder Exercises: Supine   Protraction PROM;5 reps    Horizontal ABduction PROM;5 reps    External Rotation PROM;5 reps    Internal Rotation PROM;5 reps    Flexion PROM;5 reps    ABduction PROM;5 reps      Shoulder Exercises: Sidelying   External Rotation AROM;10 reps    Internal Rotation AROM;10 reps    Flexion AROM;10 reps    ABduction AROM;10 reps    Other Sidelying Exercises protraction, A/ROM, 10X    Other Sidelying Exercises  horizontal abduction, A/ROM, 10X      Shoulder Exercises: Standing   Protraction AROM;12 reps    Horizontal ABduction AROM;12 reps    External Rotation AROM;12 reps    Internal Rotation AROM;12 reps    Flexion AROM;12 reps    ABduction AROM;12 reps    Other Standing Exercises Arms on fire: 5 positions, 15" each, 2' total      Shoulder Exercises: Therapy Ball   Other Therapy Ball Exercises green therapy ball: using 1# wrist weights,  chest press, flexion, circles each direction, PNF diagonals 10X each       Shoulder Exercises: ROM/Strengthening   X to V Arms 12X each    Other ROM/Strengthening Exercises red loop band: wall slide with lift off, lateral wall slide, diagonal wall slides, 10X each    Other ROM/Strengthening Exercises Y lift off, 10X      Shoulder Exercises: Stretch   Internal Rotation Stretch 2 reps   30" vertical towel   External Rotation Stretch 2 reps;30 seconds    Wall Stretch - Flexion 2 reps;30  seconds    Wall Stretch - ABduction 2 reps;30 seconds                    OT Short Term Goals - 11/04/19 1103      OT SHORT TERM GOAL #1   Title Pt will be provided with and educated on HEP to improve mobility in RUE required for ADL completion.    Time 4    Period Weeks    Status On-going    Target Date 10/31/19      OT SHORT TERM GOAL #2   Title Pt will increase RUE P/ROM to [facility] to improve ability to perform dressing tasks with minimal compensatory movements.    Time 4    Period Weeks    Status On-going      OT SHORT TERM GOAL #3   Title Pt will increase RUE strength to 3+/5 to improve ability to perform grooming tasks such as washing face, and eating tasks using dominant RUE.    Time 4    Period Weeks    Status Achieved             OT Long Term Goals - 10/03/19 1505      OT LONG TERM GOAL #1   Title Pt will decrease RUE pain to 3/10 or less to improve ability to sleep in the bed for 4 consecutive hours or greater at night.    Time 8     Period Weeks    Status On-going      OT LONG TERM GOAL #2   Title Pt will decrease fascial restrictions in RUE to trace amounts to improve mobility required for functional reaching tasks.    Time 8    Period Weeks    Status On-going      OT LONG TERM GOAL #3   Title Pt will increase A/ROM of RUE to [facility] to improve ability to reach overhead and behind back during dressing, bathing, etc.    Time 8    Period Weeks    Status On-going      OT LONG TERM GOAL #4   Title Pt will increase RUE strength to 4+/5 or more to improve ability to perform lifting tasks during ADLs and at work as needed.    Time 8    Period Weeks    Status On-going      OT LONG TERM GOAL #5   Title Pt will return to highest level of functioning during ADLs and work tasks using RUE as dominant.    Time 8    Period Weeks    Status On-going                 Plan - 11/18/19 1148    Clinical Impression Statement A: Pt reports improvement since his cortisone shot on Friday, improvement in flexion noted during passive stretching. No manual therapy required today as pt with trace fascial restrictions. Continued with standing A/ROM and added sidelying A/ROM, also added 1# wrist weights to therapy ball strengthening tasks. Also added red loop band exercises and arms on fire today. Pt with mod difficulty on loop band wall slide with lift off. Verbal cuing for form and technique during tasks.    Body Structure / Function / Physical Skills ADL;Endurance;UE functional use;Fascial restriction;Flexibility;Pain;ROM;IADL;Strength    Plan P: continue with red loop band tasks working on form, strengthening and scapular stability    OT Home Exercise Plan eval: table slides; 5/17: scapular A/ROM; 5/28:  AA/ROM; 6/15: scapular theraband    Consulted and Agree with Plan of Care Patient           Patient will benefit from skilled therapeutic intervention in order to improve the following deficits and impairments:   Body Structure /  Function / Physical Skills: ADL, Endurance, UE functional use, Fascial restriction, Flexibility, Pain, ROM, IADL, Strength       Visit Diagnosis: Acute pain of right shoulder  Stiffness of right shoulder, not elsewhere classified  Other symptoms and signs involving the musculoskeletal system    Problem List Patient Active Problem List   Diagnosis Date Noted  . Need for influenza vaccination 05/15/2019  . Acute pain of right shoulder 05/15/2019  . Skin lesion 03/22/2018  . Situational phobia 11/29/2016  . Tension headache 07/05/2016  . Routine general medical examination at a health care facility 03/29/2016  . Smoker 03/29/2016  . Vaccine counseling 03/29/2016  . Generalized anxiety disorder 03/29/2016   Ezra Sites, OTR/L  [phone] 11/18/2019, 11:55 AM  [facility] [address] [place], [place], [address] Phone: [phone]   Fax:  [phone]  Name: Vincent Walter MRN: [id number] Date of Birth: March 31, 1976

## 2019-11-21 ENCOUNTER — Telehealth: Payer: Self-pay | Admitting: Orthopedic Surgery

## 2019-11-21 ENCOUNTER — Other Ambulatory Visit: Payer: Self-pay

## 2019-11-21 ENCOUNTER — Encounter (HOSPITAL_COMMUNITY): Payer: Self-pay | Admitting: Occupational Therapy

## 2019-11-21 ENCOUNTER — Ambulatory Visit (HOSPITAL_COMMUNITY): Payer: BC Managed Care – PPO | Attending: Surgical | Admitting: Occupational Therapy

## 2019-11-21 DIAGNOSIS — R29898 Other symptoms and signs involving the musculoskeletal system: Secondary | ICD-10-CM | POA: Diagnosis not present

## 2019-11-21 DIAGNOSIS — M25511 Pain in right shoulder: Secondary | ICD-10-CM | POA: Diagnosis not present

## 2019-11-21 DIAGNOSIS — M25611 Stiffness of right shoulder, not elsewhere classified: Secondary | ICD-10-CM | POA: Insufficient documentation

## 2019-11-21 NOTE — Telephone Encounter (Signed)
[employer] form received. Sent to [employer] 

## 2019-11-21 NOTE — Therapy (Signed)
[facility] [address] [place], [place], [address] Phone: [phone]   Fax:  [phone]  Occupational Therapy Treatment  Patient Details  Name: Vincent Walter MRN: [id number] Date of Birth: [birth year] Referring Provider (OT): Harriette Bouillon, [place]   Encounter Date: 11/21/2019   OT End of Session - 11/21/19 1024    Visit Number 15    Number of Visits 16    Date for OT Re-Evaluation 11/30/19    Authorization Type BCBS    OT Start Time [phone]    OT Stop Time 1028    OT Time Calculation (min) 40 min    Activity Tolerance Patient tolerated treatment well    Behavior During Therapy [facility] for tasks assessed/performed           Past Medical History:  Diagnosis Date  . Anxiety   . Chronic back pain   . Headache syndrome    related to anxiety  . Tobacco abuse   . Wears glasses     Past Surgical History:  Procedure Laterality Date  . MANDIBLE FRACTURE SURGERY      There were no vitals filed for this visit.   Subjective Assessment - 11/21/19 0951    Subjective  S: It's feeling pretty good today.    Currently in Pain? No/denies              [facility] OT Assessment - 11/21/19 0951      Assessment   Medical Diagnosis s/p right bicep tenodesis      Precautions   Precautions Shoulder    Type of Shoulder Precautions progress as tolerated                    OT Treatments/Exercises (OP) - 11/21/19 0952      Exercises   Exercises Shoulder      Shoulder Exercises: Supine   Protraction PROM;5 reps    Horizontal ABduction PROM;5 reps    External Rotation PROM;5 reps    Internal Rotation PROM;5 reps    Flexion PROM;5 reps    ABduction PROM;5 reps      Shoulder Exercises: Sidelying   External Rotation Theraband;12 reps    Theraband Level (Shoulder External Rotation) Level 2 (Red)    Internal Rotation Theraband;12 reps    Theraband Level (Shoulder Internal Rotation) Level 2 (Red)    Flexion Theraband;12 reps     Theraband Level (Shoulder Flexion) Level 2 (Red)    ABduction Theraband;12 reps    Theraband Level (Shoulder ABduction) Level 2 (Red)    Other Sidelying Exercises protraction, red theraband, 12X    Other Sidelying Exercises horizontal abduction, red theraband, 12X      Shoulder Exercises: Standing   Protraction Strengthening;12 reps    Protraction Weight (lbs) 1    Horizontal ABduction Strengthening;12 reps    Horizontal ABduction Weight (lbs) 1    External Rotation Strengthening;12 reps    External Rotation Weight (lbs) 1    Internal Rotation Strengthening;12 reps    Internal Rotation Weight (lbs) 1    Flexion Strengthening;12 reps    Shoulder Flexion Weight (lbs) 1    ABduction Strengthening;12 reps    Shoulder ABduction Weight (lbs) 1    Other Standing Exercises Arms on fire: 5 positions, 15" each, 2' total; 2 rest breaks      Shoulder Exercises: ROM/Strengthening   X to V Arms 12X each, 1#    Proximal Shoulder Strengthening, Seated 12X each, 1#, no rest breaks  Other ROM/Strengthening Exercises red loop band: wall slide with lift off, lateral wall slide, diagonal wall slides, 10X each    Other ROM/Strengthening Exercises 90/90 carry, 1#, 2"                    OT Short Term Goals - 11/04/19 1103      OT SHORT TERM GOAL #1   Title Pt will be provided with and educated on HEP to improve mobility in RUE required for ADL completion.    Time 4    Period Weeks    Status On-going    Target Date 10/31/19      OT SHORT TERM GOAL #2   Title Pt will increase RUE P/ROM to [facility] to improve ability to perform dressing tasks with minimal compensatory movements.    Time 4    Period Weeks    Status On-going      OT SHORT TERM GOAL #3   Title Pt will increase RUE strength to 3+/5 to improve ability to perform grooming tasks such as washing face, and eating tasks using dominant RUE.    Time 4    Period Weeks    Status Achieved             OT Long Term Goals - 10/03/19  1505      OT LONG TERM GOAL #1   Title Pt will decrease RUE pain to 3/10 or less to improve ability to sleep in the bed for 4 consecutive hours or greater at night.    Time 8    Period Weeks    Status On-going      OT LONG TERM GOAL #2   Title Pt will decrease fascial restrictions in RUE to trace amounts to improve mobility required for functional reaching tasks.    Time 8    Period Weeks    Status On-going      OT LONG TERM GOAL #3   Title Pt will increase A/ROM of RUE to [facility] to improve ability to reach overhead and behind back during dressing, bathing, etc.    Time 8    Period Weeks    Status On-going      OT LONG TERM GOAL #4   Title Pt will increase RUE strength to 4+/5 or more to improve ability to perform lifting tasks during ADLs and at work as needed.    Time 8    Period Weeks    Status On-going      OT LONG TERM GOAL #5   Title Pt will return to highest level of functioning during ADLs and work tasks using RUE as dominant.    Time 8    Period Weeks    Status On-going                 Plan - 11/21/19 1007    Clinical Impression Statement A: Pt reports continued improvement in ROM. Continued with passive stretching, progressed to strengthening using red theraband and 1# weights today. Continued with red loop band exercises for scapular stability-most difficulty with wall slide and lift off, adding 1# wrist weights; also added 90/90 carry. Verbal cuing for form and technique.    Body Structure / Function / Physical Skills ADL;Endurance;UE functional use;Fascial restriction;Flexibility;Pain;ROM;IADL;Strength    Plan P: Continue with shoulder strengthening and stabilization, add diagonals with red theraband    OT Home Exercise Plan eval: table slides; 5/17: scapular A/ROM; 5/28: AA/ROM; 6/15: scapular theraband    Consulted and Agree  with Plan of Care Patient           Patient will benefit from skilled therapeutic intervention in order to improve the following  deficits and impairments:   Body Structure / Function / Physical Skills: ADL, Endurance, UE functional use, Fascial restriction, Flexibility, Pain, ROM, IADL, Strength       Visit Diagnosis: Acute pain of right shoulder  Stiffness of right shoulder, not elsewhere classified  Other symptoms and signs involving the musculoskeletal system    Problem List Patient Active Problem List   Diagnosis Date Noted  . Need for influenza vaccination 05/15/2019  . Acute pain of right shoulder 05/15/2019  . Skin lesion 03/22/2018  . Situational phobia 11/29/2016  . Tension headache 07/05/2016  . Routine general medical examination at a health care facility 03/29/2016  . Smoker 03/29/2016  . Vaccine counseling 03/29/2016  . Generalized anxiety disorder 03/29/2016   Ezra Sites, OTR/L  [phone] 11/21/2019, 10:28 AM  [facility] [address] [place], [place], [address] Phone: [phone]   Fax:  [phone]  Name: Vincent Walter MRN: [id number] Date of Birth: 23-Jan-1976

## 2019-11-25 ENCOUNTER — Ambulatory Visit (HOSPITAL_COMMUNITY): Payer: BC Managed Care – PPO | Admitting: Occupational Therapy

## 2019-11-25 ENCOUNTER — Encounter (HOSPITAL_COMMUNITY): Payer: Self-pay | Admitting: Occupational Therapy

## 2019-11-25 ENCOUNTER — Other Ambulatory Visit: Payer: Self-pay

## 2019-11-25 DIAGNOSIS — M25611 Stiffness of right shoulder, not elsewhere classified: Secondary | ICD-10-CM | POA: Diagnosis not present

## 2019-11-25 DIAGNOSIS — R29898 Other symptoms and signs involving the musculoskeletal system: Secondary | ICD-10-CM | POA: Diagnosis not present

## 2019-11-25 DIAGNOSIS — M25511 Pain in right shoulder: Secondary | ICD-10-CM | POA: Diagnosis not present

## 2019-11-25 NOTE — Therapy (Signed)
[facility] [address] [place], [place], [address] Phone: [phone]   Fax:  [phone]  Occupational Therapy Treatment  Patient Details  Name: Vincent Walter MRN: [id number] Date of Birth: [birth year] Referring Provider (OT): Harriette Bouillon, [place]   Encounter Date: 11/25/2019   OT End of Session - 11/25/19 1021    Visit Number 16    Number of Visits 17    Date for OT Re-Evaluation 11/30/19    Authorization Type BCBS    OT Start Time 0945    OT Stop Time 1023    OT Time Calculation (min) 38 min    Activity Tolerance Patient tolerated treatment well    Behavior During Therapy [facility] for tasks assessed/performed           Past Medical History:  Diagnosis Date  . Anxiety   . Chronic back pain   . Headache syndrome    related to anxiety  . Tobacco abuse   . Wears glasses     Past Surgical History:  Procedure Laterality Date  . MANDIBLE FRACTURE SURGERY      There were no vitals filed for this visit.   Subjective Assessment - 11/25/19 0943    Subjective  S: It's not too bad.    Currently in Pain? No/denies              [facility] OT Assessment - 11/25/19 0943      Assessment   Medical Diagnosis s/p right bicep tenodesis      Precautions   Precautions Shoulder    Type of Shoulder Precautions progress as tolerated                    OT Treatments/Exercises (OP) - 11/25/19 0945      Exercises   Exercises Shoulder      Shoulder Exercises: Supine   Protraction PROM;5 reps    Horizontal ABduction PROM;5 reps    External Rotation PROM;5 reps    Internal Rotation PROM;5 reps    Flexion PROM;5 reps    ABduction PROM;5 reps      Shoulder Exercises: Sidelying   External Rotation Theraband;12 reps    Theraband Level (Shoulder External Rotation) Level 2 (Red)    Internal Rotation Theraband;12 reps    Theraband Level (Shoulder Internal Rotation) Level 2 (Red)    Flexion Theraband;12 reps    Theraband Level  (Shoulder Flexion) Level 2 (Red)    ABduction Theraband;12 reps    Theraband Level (Shoulder ABduction) Level 2 (Red)    Other Sidelying Exercises protraction, red theraband, 12X    Other Sidelying Exercises horizontal abduction, red theraband, 12X      Shoulder Exercises: Standing   Horizontal ABduction Theraband;10 reps    Theraband Level (Shoulder Horizontal ABduction) Level 2 (Red)    External Rotation Theraband;10 reps    Theraband Level (Shoulder External Rotation) Level 2 (Red)    Flexion Strengthening;12 reps    Shoulder Flexion Weight (lbs) 1    ABduction Strengthening;12 reps    Shoulder ABduction Weight (lbs) 1    Diagonals Theraband;10 reps   PNF: D1 & D2 patterns   Theraband Level (Shoulder Diagonals) Level 2 (Red)    Other Standing Exercises Arms on fire: 5 positions, 15" each, 2' total; 2 rest breaks      Shoulder Exercises: ROM/Strengthening   Wall Pushups 15 reps    X to V Arms 12X each, 1#    Other ROM/Strengthening Exercises ball drop at  90 degrees flexion: 1' ; ABC writing using 1#    Other ROM/Strengthening Exercises 90/90 carry, 2#, 2"      Shoulder Exercises: Stretch   External Rotation Stretch 2 reps;30 seconds      Shoulder Exercises: Body Blade   Flexion 5 reps   holding last rep for 10" at end ROM   ABduction 5 reps   holding last rep for 10" at end ROM   External Rotation 5 reps   holding last rep for 10" at end ROM   Internal Rotation 5 reps                    OT Short Term Goals - 11/04/19 1103      OT SHORT TERM GOAL #1   Title Pt will be provided with and educated on HEP to improve mobility in RUE required for ADL completion.    Time 4    Period Weeks    Status On-going    Target Date 10/31/19      OT SHORT TERM GOAL #2   Title Pt will increase RUE P/ROM to [facility] to improve ability to perform dressing tasks with minimal compensatory movements.    Time 4    Period Weeks    Status On-going      OT SHORT TERM GOAL #3   Title Pt  will increase RUE strength to 3+/5 to improve ability to perform grooming tasks such as washing face, and eating tasks using dominant RUE.    Time 4    Period Weeks    Status Achieved             OT Long Term Goals - 10/03/19 1505      OT LONG TERM GOAL #1   Title Pt will decrease RUE pain to 3/10 or less to improve ability to sleep in the bed for 4 consecutive hours or greater at night.    Time 8    Period Weeks    Status On-going      OT LONG TERM GOAL #2   Title Pt will decrease fascial restrictions in RUE to trace amounts to improve mobility required for functional reaching tasks.    Time 8    Period Weeks    Status On-going      OT LONG TERM GOAL #3   Title Pt will increase A/ROM of RUE to [facility] to improve ability to reach overhead and behind back during dressing, bathing, etc.    Time 8    Period Weeks    Status On-going      OT LONG TERM GOAL #4   Title Pt will increase RUE strength to 4+/5 or more to improve ability to perform lifting tasks during ADLs and at work as needed.    Time 8    Period Weeks    Status On-going      OT LONG TERM GOAL #5   Title Pt will return to highest level of functioning during ADLs and work tasks using RUE as dominant.    Time 8    Period Weeks    Status On-going                 Plan - 11/25/19 0959    Clinical Impression Statement A: Continued with passive stretching and shoulder strengthening/stability today. Increased weight for 90/90 carry to 2#, added diagonals with red theraband. Continued with 90/90 carry and arms on fire, added ball drop in flexion and body blade. Mod  difficulty with control during body blade tasks. Verbal cuing for form and technique.    Body Structure / Function / Physical Skills ADL;Endurance;UE functional use;Fascial restriction;Flexibility;Pain;ROM;IADL;Strength    Plan P: Reassessment, FOTO, Discharge with HEP    OT Home Exercise Plan eval: table slides; 5/17: scapular A/ROM; 5/28: AA/ROM; 6/15:  scapular theraband    Consulted and Agree with Plan of Care Patient           Patient will benefit from skilled therapeutic intervention in order to improve the following deficits and impairments:   Body Structure / Function / Physical Skills: ADL, Endurance, UE functional use, Fascial restriction, Flexibility, Pain, ROM, IADL, Strength       Visit Diagnosis: Acute pain of right shoulder  Stiffness of right shoulder, not elsewhere classified  Other symptoms and signs involving the musculoskeletal system    Problem List Patient Active Problem List   Diagnosis Date Noted  . Need for influenza vaccination 05/15/2019  . Acute pain of right shoulder 05/15/2019  . Skin lesion 03/22/2018  . Situational phobia 11/29/2016  . Tension headache 07/05/2016  . Routine general medical examination at a health care facility 03/29/2016  . Smoker 03/29/2016  . Vaccine counseling 03/29/2016  . Generalized anxiety disorder 03/29/2016    Ezra Sites, OTR/L  [phone]  11/25/2019, 10:23 AM  [facility] [address] [place], [place], [address] Phone: [phone]   Fax:  [phone]  Name: Vincent Walter MRN: [id number] Date of Birth: 04/02/1976

## 2019-11-28 ENCOUNTER — Encounter (HOSPITAL_COMMUNITY): Payer: Self-pay | Admitting: Occupational Therapy

## 2019-11-28 ENCOUNTER — Other Ambulatory Visit: Payer: Self-pay

## 2019-11-28 ENCOUNTER — Ambulatory Visit (HOSPITAL_COMMUNITY): Payer: BC Managed Care – PPO | Admitting: Occupational Therapy

## 2019-11-28 DIAGNOSIS — M25611 Stiffness of right shoulder, not elsewhere classified: Secondary | ICD-10-CM | POA: Diagnosis not present

## 2019-11-28 DIAGNOSIS — M25511 Pain in right shoulder: Secondary | ICD-10-CM | POA: Diagnosis not present

## 2019-11-28 DIAGNOSIS — R29898 Other symptoms and signs involving the musculoskeletal system: Secondary | ICD-10-CM | POA: Diagnosis not present

## 2019-11-28 NOTE — Patient Instructions (Signed)

## 2019-11-28 NOTE — Therapy (Signed)
[place], [place], [address] Phone: [phone]   Fax:  [phone]  Occupational Therapy Reassessment, Treatment, Discharge Summary   Patient Details  Name: Vincent Walter MRN: [id number] Date of Birth: [birth year] Referring Provider (OT): Gloriann Loan, [place]   Encounter Date: 11/28/2019   OT End of Session - 11/28/19 1020    Visit Number 17    Number of Visits 17    Date for OT Re-Evaluation 11/30/19    Authorization Type BCBS    OT Start Time 0945    OT Stop Time 1019    OT Time Calculation (min) 34 min    Activity Tolerance Patient tolerated treatment well    Behavior During Therapy WFL for tasks assessed/performed           Past Medical History:  Diagnosis Date   Anxiety    Chronic back pain    Headache syndrome    related to anxiety   Tobacco abuse    Wears glasses     Past Surgical History:  Procedure Laterality Date   MANDIBLE FRACTURE SURGERY      There were no vitals filed for this visit.   Subjective Assessment - 11/28/19 0946    Subjective  S: I can reach a bowl in the top of the cabinet.    Currently in Pain? Yes    Pain Score 2     Pain Location Shoulder    Pain Orientation Right    Pain Descriptors / Indicators Aching;Sore    Pain Type Acute pain    Pain Radiating Towards elbow    Pain Onset More than a month ago    Pain Frequency Intermittent    Aggravating Factors  movement    Pain Relieving Factors rest    Effect of Pain on Daily Activities min effect on ADLs    Multiple Pain Sites No              [facility] OT Assessment - 11/28/19 0946      Assessment   Medical Diagnosis s/p right bicep tenodesis      Precautions   Precautions Shoulder    Type of Shoulder Precautions progress as tolerated      Observation/Other Assessments   Focus on Therapeutic Outcomes (FOTO)  61/100   59/100 previous     Palpation   Palpation comment Trace fascial restrictions in  upper arm, trapezius, scapular regions      AROM   Overall AROM Comments Assessed seated, er/IR adducted    AROM Assessment Site Shoulder    Right/Left Shoulder Right    Right Shoulder Flexion 152 Degrees   109 previous   Right Shoulder ABduction 165 Degrees   90 previous   Right Shoulder Internal Rotation 90 Degrees   same as previous   Right Shoulder External Rotation 68 Degrees   58 previous     PROM   Overall PROM Comments Assessed supine, er/IR adducted    PROM Assessment Site Shoulder    Right/Left Shoulder Right    Right Shoulder Flexion 150 Degrees   140 previous   Right Shoulder ABduction 180 Degrees   145 previous   Right Shoulder Internal Rotation 90 Degrees   same as previous   Right Shoulder External Rotation 62 Degrees   44 previous     Strength   Overall Strength Comments Assessed seated, er/IR adducted    Strength Assessment Site Shoulder    Right/Left Shoulder Right  Right Shoulder Flexion 5/5   4/5 previous   Right Shoulder ABduction 4-/5   3+/5 previous   Right Shoulder Internal Rotation 5/5   4+/5 previous   Right Shoulder External Rotation 5/5   4-/5 previous                   OT Treatments/Exercises (OP) - 11/28/19 0947      Exercises   Exercises Shoulder      Shoulder Exercises: Supine   Protraction PROM;5 reps    Horizontal ABduction PROM;5 reps    External Rotation PROM;5 reps    Internal Rotation PROM;5 reps    Flexion PROM;5 reps    ABduction PROM;5 reps      Shoulder Exercises: Standing   Protraction Theraband;10 reps    Theraband Level (Shoulder Protraction) Level 3 (Green)    Horizontal ABduction Theraband;10 reps    Theraband Level (Shoulder Horizontal ABduction) Level 3 (Green)    External Rotation Theraband;10 reps    Theraband Level (Shoulder External Rotation) Level 3 (Green)    Internal Rotation Theraband;10 reps    Theraband Level (Shoulder Internal Rotation) Level 3 (Green)    Flexion Theraband;10 reps     Theraband Level (Shoulder Flexion) Level 3 (Green)    ABduction Strengthening;12 reps;Theraband;10 reps   beginning with arms at 90 degrees abduction   Theraband Level (Shoulder ABduction) Level 3 (Green)    Shoulder ABduction Weight (lbs) 1    Other Standing Exercises Arms on fire: 5 positions, 15" each, 2' total; 2 rest breaks                  OT Education - 11/28/19 1004    Education Details theraband strengthening    Person(s) Educated Patient    Methods Explanation;Demonstration;Handout    Comprehension Verbalized understanding;Returned demonstration            OT Short Term Goals - 11/28/19 1000      OT SHORT TERM GOAL #1   Title Pt will be provided with and educated on HEP to improve mobility in RUE required for ADL completion.    Time 4    Period Weeks    Status Achieved    Target Date 10/31/19      OT SHORT TERM GOAL #2   Title Pt will increase RUE P/ROM to [facility] to improve ability to perform dressing tasks with minimal compensatory movements.    Time 4    Period Weeks    Status Achieved      OT SHORT TERM GOAL #3   Title Pt will increase RUE strength to 3+/5 to improve ability to perform grooming tasks such as washing face, and eating tasks using dominant RUE.    Time 4    Period Weeks    Status Achieved             OT Long Term Goals - 11/28/19 1000      OT LONG TERM GOAL #1   Title Pt will decrease RUE pain to 3/10 or less to improve ability to sleep in the bed for 4 consecutive hours or greater at night.    Time 8    Period Weeks    Status Achieved      OT LONG TERM GOAL #2   Title Pt will decrease fascial restrictions in RUE to trace amounts to improve mobility required for functional reaching tasks.    Time 8    Period Weeks    Status Achieved  OT LONG TERM GOAL #3   Title Pt will increase A/ROM of RUE to [facility] to improve ability to reach overhead and behind back during dressing, bathing, etc.    Time 8    Period Weeks    Status  Achieved      OT LONG TERM GOAL #4   Title Pt will increase RUE strength to 4+/5 or more to improve ability to perform lifting tasks during ADLs and at work as needed.    Baseline met with exeption of abduction which is at 4-/5    Time 8    Period Weeks    Status Partially Met      OT LONG TERM GOAL #5   Title Pt will return to highest level of functioning during ADLs and work tasks using RUE as dominant.    Time 8    Period Weeks    Status Achieved                 Plan - 11/28/19 1020    Clinical Impression Statement A: Reassessment completed this session, pt has met all goals with exception of 1 LTG which is partially met. Pt has made great progress with pain, ROM, and strength, reporting he is able to reach overhead cabinets and perform ADLs and yardwork tasks without difficulty. Continued with strengthening this session using green theraband, and updated HEP. Pt is agreeable to discharge with HEP for continued strengthening today.    Body Structure / Function / Physical Skills ADL;Endurance;UE functional use;Fascial restriction;Flexibility;Pain;ROM;IADL;Strength    Plan P: Discharge    OT Home Exercise Plan eval: table slides; 5/17: scapular A/ROM; 5/28: AA/ROM; 6/15: scapular theraband; 7/9: green theraband strengthening    Consulted and Agree with Plan of Care Patient           Patient will benefit from skilled therapeutic intervention in order to improve the following deficits and impairments:   Body Structure / Function / Physical Skills: ADL, Endurance, UE functional use, Fascial restriction, Flexibility, Pain, ROM, IADL, Strength       Visit Diagnosis: Acute pain of right shoulder  Stiffness of right shoulder, not elsewhere classified  Other symptoms and signs involving the musculoskeletal system    Problem List Patient Active Problem List   Diagnosis Date Noted   Need for influenza vaccination 05/15/2019   Acute pain of right shoulder 05/15/2019    Skin lesion 03/22/2018   Situational phobia 11/29/2016   Tension headache 07/05/2016   Routine general medical examination at a health care facility 03/29/2016   Smoker 03/29/2016   Vaccine counseling 03/29/2016   Generalized anxiety disorder 03/29/2016   Guadelupe Sabin, OTR/L  [phone] 11/28/2019, 10:22 AM  [place], [place], [address] Phone: [phone]   Fax:  [phone]  Name: Vincent Walter MRN: [id number] Date of Birth: July 04, 1975   OCCUPATIONAL THERAPY DISCHARGE SUMMARY  Visits from Start of Care: 17  Current functional level related to goals / functional outcomes: See above. Pt is able to complete ADLs and I/ADLs without difficulty, has greatly improved ROM, strength,and functional use of RUE.    Remaining deficits: Continued weakness with abduction, decreased activity tolerance   Education / Equipment: HEP for RUE strengthening and stability  Plan: Patient agrees to discharge.  Patient goals were met. Patient is being discharged due to meeting the stated rehab goals.  ?????

## 2019-12-03 ENCOUNTER — Other Ambulatory Visit: Payer: Self-pay | Admitting: Medical

## 2019-12-04 NOTE — Telephone Encounter (Signed)
Since he uses xanax regularly, lets do a 6 mo med check in person follow up.

## 2019-12-25 ENCOUNTER — Other Ambulatory Visit: Payer: Self-pay | Admitting: Medical

## 2020-01-14 ENCOUNTER — Other Ambulatory Visit: Payer: Self-pay | Admitting: Medical

## 2020-01-15 ENCOUNTER — Other Ambulatory Visit: Payer: Self-pay | Admitting: Medical

## 2020-01-15 ENCOUNTER — Telehealth: Payer: Self-pay | Admitting: Medical

## 2020-01-15 NOTE — Telephone Encounter (Signed)
Pt called and scheduled a Mychart appt from tomorrow. Pt is going out of town. He is requesting medication be refilled today.

## 2020-01-15 NOTE — Telephone Encounter (Signed)
Patient has appointment scheduled for tomorrow and would like his Xanax to be refilled today. Please advise.

## 2020-01-15 NOTE — Telephone Encounter (Signed)
Patient has scheduled an appointment 

## 2020-01-16 ENCOUNTER — Other Ambulatory Visit: Payer: Self-pay

## 2020-01-16 ENCOUNTER — Encounter: Payer: Self-pay | Admitting: Medical

## 2020-01-16 ENCOUNTER — Telehealth (INDEPENDENT_AMBULATORY_CARE_PROVIDER_SITE_OTHER): Payer: BC Managed Care – PPO | Admitting: Medical

## 2020-01-16 VITALS — Ht 68.0 in | Wt 145.0 lb

## 2020-01-16 DIAGNOSIS — F17200 Nicotine dependence, unspecified, uncomplicated: Secondary | ICD-10-CM | POA: Diagnosis not present

## 2020-01-16 DIAGNOSIS — F411 Generalized anxiety disorder: Secondary | ICD-10-CM

## 2020-01-16 DIAGNOSIS — Z9889 Other specified postprocedural states: Secondary | ICD-10-CM

## 2020-01-16 DIAGNOSIS — M25519 Pain in unspecified shoulder: Secondary | ICD-10-CM | POA: Diagnosis not present

## 2020-01-16 DIAGNOSIS — G8929 Other chronic pain: Secondary | ICD-10-CM | POA: Insufficient documentation

## 2020-01-16 MED ORDER — ALPRAZOLAM 0.25 MG PO TABS: 0.2500 mg | ORAL_TABLET | Freq: Every day | ORAL | 1 refills | Status: DC | PRN

## 2020-01-16 MED ORDER — BUSPIRONE HCL 5 MG PO TABS: 5.0000 mg | ORAL_TABLET | Freq: Two times a day (BID) | ORAL | 1 refills | Status: DC

## 2020-01-16 NOTE — Patient Instructions (Signed)
Begin trial of buspirone 5 mg.  Start this out once daily.  If needed this can be taken twice daily.  This medicine is indicated to be taken twice daily but you may find that it helps just taking it once daily.  This would be a daily medicine to help control anxious feelings.  You can continue Xanax for now but try to use this more sparingly.  We discussed ways to deal with stress and anxiety.  I recommend regular exercise such as 30 minutes or more most days of the week such as walking running and bicycling  I recommend taking some time to meditate or pray daily to help slow racing thoughts.  I recommend working on relaxation techniques such as deep breathing exercises in a comfortable position relaxing your body.  There are free Apps on the smart phone for this for example  Consider getting a massage  Journal or use diary to express your ideas on paper to cope with anxiety and stress  Work on time management, use a calendar or plan out things to avoid stressing about things. Find ways to utilize your time to include exercise and personal "me" time.  Some people use aromatherapy such as lavender to relax  Some people use herbal teas to help calm their mood  Spend some time with animals or your pet if you have one  Consider seeing a counselor to help deal with anxiety and work on specific techniques    RESOURCES in [place], [place]  If you are experiencing a mental health crisis or an emergency, please call 911 or go to the nearest emergency department.  [facility]   [phone] [facility]  [phone] [facility]   [phone]  Suicide Hotline 1-800-Suicide 407 873 5407)  National Suicide Prevention Lifeline 469 867 8315  317-092-4353)  Domestic Violence, Rape/Crisis - [facility] [place] 841-660-6301  [employer] Violence Hotline 1-800-799-SAFE (684) 134-4513)  To report Child or Elder Abuse, please call: [facility]  Police Department  [phone] [facility] Department  [phone]  Teen Crisis line 978 729 7152 or [phone]       Dr. Eliseo Squires [phone] [place], [place] [address]   [facility] [address] [place], [place] [address] [phone]

## 2020-01-16 NOTE — Progress Notes (Signed)
Subjective:     Patient ID: Vincent Walter, male   DOB: [birth year], 44 y.o.   MRN: [id number]  This visit type was conducted due to national recommendations for restrictions regarding the COVID-19 Pandemic (e.g. social distancing) in an effort to limit this patient's exposure and mitigate transmission in our community.  Due to their co-morbid illnesses, this patient is at least at moderate risk for complications without adequate follow up.  This format is felt to be most appropriate for this patient at this time.    Documentation for virtual audio and video telecommunications through [place] encounter:  The patient was located at home. The provider was located in the office. The patient did consent to this visit and is aware of possible charges through their insurance for this visit.  The other persons participating in this telemedicine service were none. Time spent on call was 20 minutes and in review of previous records 20 minutes total.  This virtual service is not related to other E/M service within previous 7 days.   HPI Chief Complaint  Patient presents with  . Medication Management   Headed to a weeding this weekend, [place], [place].  Been doing PT for 6 weeks.  Had surgery after having torn biceps and frozen shoulder.  Just started a new job.  Just had shoulder surgery in recent past.   New job allows him to cut down on driving time.  Been stressed about kids going back to school for new school year.   Has had to do traveling a lot in last few months.   Worried about covid and exposure.  Is able to see family more now, less work hours.  Prior medications included.  Prior medications included Citalopram 2017, paroxetine 2018, Effexor 2018.    Doesn't use xanax on weekends.  He typically takes his Xanax on the way to work.  He tends to get more stressed during the work days particularly in the mornings.  He has found himself occasionally using Xanax twice a day in recent months.  He has  been doing with his shoulder injury and status post shoulder surgery and rehab.  He still gets pain at night when he sleeps.  So sometimes he uses Xanax at night to help with sleep given the shoulder issues.  He does this so he does not allow him pain medicine  No other aggravating or relieving factors. No other complaint.  Past Medical History:  Diagnosis Date  . Anxiety   . Chronic back pain   . Headache syndrome    related to anxiety  . Tobacco abuse   . Wears glasses    No current outpatient medications on file prior to visit.   No current facility-administered medications on file prior to visit.    Review of Systems As in subjective    Objective:   Physical Exam Due to coronavirus pandemic stay at home measures, patient visit was virtual and they were not examined in person.   Ht 5\' 8"  (1.727 m)   Wt 145 lb (65.8 kg)   BMI 22.05 kg/m   Gen: wd, wn, nad Psych: pleasant, good eye contact, answers questions appropriately     Assessment:     Encounter Diagnoses  Name Primary?  . Generalized anxiety disorder Yes  . Smoker   . Chronic shoulder pain, unspecified laterality   . S/P shoulder surgery        Plan:     Counseled on ways to cope and deal with anxiety.  We discussed several strategies to deal with stress.  He does have a new job which is less stressful and closer to home.  Begin trial of buspirone.  He had been using a little more Xanax than prior.  I have advised we get away from that since Xanax is more of an acute medicine not meant to be daily long-term use.   Discussed risk and benefits of medication, call report in 2 to 3 weeks regarding changing to buspirone.  Of note prior medicines citalopram and paroxetine gave him significant sedation even at low doses.  Strongly recommend he get in with counseling.  I gave him a list of counselors in the area  He is status post shoulder surgery and still dealing with some pain with that.  He is seeing  orthopedics.   Call report in 3-4 weeks.   Caliber was seen today for medication management.  Diagnoses and all orders for this visit:  Generalized anxiety disorder  Smoker  Chronic shoulder pain, unspecified laterality  S/P shoulder surgery  Other orders -     ALPRAZolam (XANAX) 0.25 MG tablet; Take 1 tablet (0.25 mg total) by mouth daily as needed for anxiety. -     busPIRone (BUSPAR) 5 MG tablet; Take 1 tablet (5 mg total) by mouth in the morning and at bedtime.  f/u for physical in 04/2020 as planned.

## 2020-01-27 ENCOUNTER — Telehealth: Payer: Self-pay | Admitting: Medical

## 2020-01-27 NOTE — Telephone Encounter (Signed)
Pt emailed form to be filled out for work, pt states that that he is listed as a non smoker, form is in genera folder please get from her and then after filled out please give back to me and I will e-mail back to pt

## 2020-01-28 NOTE — Telephone Encounter (Signed)
I have not seen the form

## 2020-01-29 NOTE — Telephone Encounter (Signed)
The form is in genera form on her desk in red folder  suppose to get from her as protocol

## 2020-01-30 NOTE — Telephone Encounter (Signed)
Form completed and sent to patient.

## 2020-01-30 NOTE — Telephone Encounter (Signed)
See form

## 2020-02-10 ENCOUNTER — Other Ambulatory Visit: Payer: Self-pay | Admitting: Medical

## 2020-03-12 ENCOUNTER — Other Ambulatory Visit: Payer: Self-pay | Admitting: Medical

## 2020-03-15 ENCOUNTER — Telehealth: Payer: Self-pay | Admitting: Medical

## 2020-03-15 NOTE — Telephone Encounter (Signed)
Last visit/televisit I tried him on low dose Buspirone for anxiety with the ability to increase dosing if not seeing improvement.  He was suppose to call back within 2-3 weeks.  I didn't receive call back.  Please call and see what his experience was?    The buspirone  Is less habit forming/less addictive choice.    I wanted to inquire since I got a refill request on medication today

## 2020-03-16 ENCOUNTER — Other Ambulatory Visit: Payer: Self-pay | Admitting: Medical

## 2020-03-16 MED ORDER — BUSPIRONE HCL 7.5 MG PO TABS: 7.5000 mg | ORAL_TABLET | Freq: Two times a day (BID) | ORAL | 1 refills | Status: DC

## 2020-03-16 MED ORDER — ALPRAZOLAM 0.25 MG PO TABS: 0.2500 mg | ORAL_TABLET | Freq: Every day | ORAL | 1 refills | Status: DC | PRN

## 2020-03-16 NOTE — Telephone Encounter (Signed)
Spoke to patient and he said it is not too bad. It's helping in the daytime but he still feels panicky and exhausted when he gets in the car to drive. It's still hard for him to get going in the morning but it has not been like the ones in the past where he couldn't get going at all. Patient stated he is still taking Xanax as well. Please advise if there needs to be any changes.

## 2020-03-16 NOTE — Telephone Encounter (Signed)
Ok, lets increase the Buspar to 7.5mg  BID. I refilled both medications

## 2020-03-17 NOTE — Telephone Encounter (Signed)
Unable to reach patient by phone. Sent message via mychart.

## 2020-03-25 ENCOUNTER — Other Ambulatory Visit: Payer: Self-pay

## 2020-03-25 ENCOUNTER — Encounter: Payer: Self-pay | Admitting: Medical

## 2020-03-25 ENCOUNTER — Telehealth (INDEPENDENT_AMBULATORY_CARE_PROVIDER_SITE_OTHER): Payer: BC Managed Care – PPO | Admitting: Family Medicine

## 2020-03-25 ENCOUNTER — Encounter: Payer: Self-pay | Admitting: Family Medicine

## 2020-03-25 VITALS — Temp 97.8°F | Ht 68.0 in | Wt 145.0 lb

## 2020-03-25 DIAGNOSIS — R0989 Other specified symptoms and signs involving the circulatory and respiratory systems: Secondary | ICD-10-CM | POA: Diagnosis not present

## 2020-03-25 DIAGNOSIS — Z720 Tobacco use: Secondary | ICD-10-CM | POA: Diagnosis not present

## 2020-03-25 DIAGNOSIS — J302 Other seasonal allergic rhinitis: Secondary | ICD-10-CM | POA: Diagnosis not present

## 2020-03-25 NOTE — Progress Notes (Signed)
done

## 2020-03-25 NOTE — Progress Notes (Signed)
Start time: 1:32 End time: 1:58  Virtual Visit via Video Note  I connected with Vincent Walter on 03/25/20 by a video enabled telemedicine application and verified that I am speaking with the correct person using two identifiers.  Location: Patient: home Provider: office   I discussed the limitations of evaluation and management by telemedicine and the availability of in person appointments. The patient expressed understanding and agreed to proceed.  History of Present Illness:  Chief Complaint  Patient presents with   Cough    VIRTUAL cough, had ST Sunday night into Monday. Nasal congestion. No fever, chills or body aches. Has not had any covid vaccines.    Got flu shot last Tuesday. He felt fine afterwards, hasn't had reactions in the past. Sunday (4 days ago), he was outside doing yardwork. That night he developed a raspy sore throat.  The next day (3 days ago), he woke up with dry throat, head congestion, slight cough.  He took Monday off from work, stayed home the rest of the week due to ongoing illness. He presents for office visit today due to the fact that he is on the 4th missed day, and he needs to be seen (and needs note).  He reports that he took a home COVID test today, which was negative.  He has not been vaccinated and isn't interested.  Today he reports that he still feels congested.  He took a dose of Mucinex yesterday, and he feels like he can get the congestion out more--but still congested in head and chest.  He reports that he hasn't even be able to work from home, due to his congestion, preferring to rest. He has a little runny nose. Not blowing his nose. Expectorated phlegm is greenish, thick, sticky. He has had some mild allergies in the past, usually in the Spring with pollen. He did have itchy throat on Monday, and has had some sneezing.  Denies itchy eyes/ears. He hasn't had any myalgias, fever or chills.  No sick contacts.  He has been taking Nyquil and  Dayquil, which helps. Took Theraflu last night. He also has Mucinex DM 12 tablet and plain Mucinex 12 hour tablets at home.  Smoker, 1 PPD.  His wife also smokes.  He has thought about quitting, hasn't done anything about it.  He has cut back on how much he has been smoking since he has been sick.  PMH, PSH, SH reviewed and updated  Outpatient Encounter Medications as of 03/25/2020  Medication Sig   ALPRAZolam (XANAX) 0.25 MG tablet Take 1 tablet (0.25 mg total) by mouth daily as needed for anxiety.   busPIRone (BUSPAR) 5 MG tablet TAKE 1 TABLET (5 MG TOTAL) BY MOUTH IN THE MORNING AND AT BEDTIME.   diphenhydrAMINE-PE-APAP (THERAFLU EXPRESSMAX PO) Take 30 mLs by mouth as needed.   Pseudoeph-Doxylamine-DM-APAP (NYQUIL PO) Take 2 tablets by mouth as needed.   Pseudoephedrine-APAP-DM (DAYQUIL PO) Take 2 tablets by mouth as needed.   busPIRone (BUSPAR) 7.5 MG tablet Take 1 tablet (7.5 mg total) by mouth 2 (two) times daily. (Patient not taking: Reported on 03/25/2020)   No facility-administered encounter medications on file as of 03/25/2020.   No Known Allergies  ROS: no fever, chills, chest pain, shortness of breath, GI complaints, rash.  URI symptoms per HPI.   Observations/Objective:  Temp 97.8 F (36.6 C) (Temporal)    Ht 5\' 8"  (1.727 m)    Wt 145 lb (65.8 kg)    BMI 22.05 kg/m  Alert, oriented, well-appearing male in no distress. He isn't coughing, is speaking easily, in no distress Cranial nerves are grossly intact. Normal mood, affect, grooming. Exam is limited due to virtual nature of the visit.   Assessment and Plan:  Seasonal allergic rhinitis, unspecified trigger - component of seasonal allergies vs virus.  Recommended 24 hr antihistamine in addition to the mucinex (+/- dayquil/nyquil if needed)  Chest congestion - To take Mucinex (plain) in addition to the Dayquil/Nyquil  Tobacco abuse - counseled at length regarding risks of smoking, how/why to quit. Encouraged  setting quit date with his wife  Supportive measures reviewed, and signs/symptoms of bacterial infection reviewed. To contact us if symptoms persist/worsen Note written stating he was seen today, symptoms started Monday; okay to RTW Monday (declined going back tomorrow).   Drink plenty of water. Take the plain 12 hour mucinex twice daily, in addition to the Dayquil and Nyquil as directed on the package. You can consider also taking claritin once daily (plain, NOT the D version), in case there are allergies contributing to the congestion. If you start feeling better, you can cut back on the dayquil/nyquil.  You can continue the mucinex until secretions have resolved or are very thin.  If your cough recurs upon stopping the dayquil/nyquil, you can switch over to the Mucinex DM instead of the plain mucinex.  Contact us in the next few days if your mucus or phlegm continues to be discolored, if you have persistent or worsening symptoms, sinus pain, etc. Leave a detailed message (through MyChart).  Please quit smoking.  This increases your risk for bacterial infections, in addition to all the known cardiovascular and pulmonary risks.    Follow Up Instructions:    I discussed the assessment and treatment plan with the patient. The patient was provided an opportunity to ask questions and all were answered. The patient agreed with the plan and demonstrated an understanding of the instructions.   The patient was advised to call back or seek an in-person evaluation if the symptoms worsen or if the condition fails to improve as anticipated.  I provided 26 minutes of video face-to-face time during this encounter.  Additional 5-8 minutes was spent in chart review and documentation.   Lavonda Jumbo, MD

## 2020-03-25 NOTE — Patient Instructions (Signed)
  Drink plenty of water. Take the plain 12 hour mucinex twice daily, in addition to the Dayquil and Nyquil as directed on the package. You can consider also taking claritin once daily (plain, NOT the D version), in case there are allergies contributing to the congestion. If you start feeling better, you can cut back on the dayquil/nyquil.  You can continue the mucinex until secretions have resolved or are very thin.  If your cough recurs upon stopping the dayquil/nyquil, you can switch over to the Mucinex DM instead of the plain mucinex.  Contact us in the next few days if your mucus or phlegm continues to be discolored, if you have persistent or worsening symptoms, sinus pain, etc. Leave a detailed message (through MyChart).  Please quit smoking.  This increases your risk for bacterial infections, in addition to all the known cardiovascular and pulmonary risks.    Please try and quit smoking--start thinking about why/when you smoke (habit, boredom, stress) in order to come up with effective strategies to cut back or quit. Available resources to help you quit include free counseling through [facility] ([facility]uit[facility] or 1-800-QUITNOW), smoking cessation classes through [facility] (call to find out schedule), over-the-counter nicotine replacements, and e-cigarettes (although this may not help break the hand-mouth habit).  Many insurance companies also have smoking cessation programs (which may decrease the cost of patches, meds if enrolled).  Please set a quit date, and do this in partnership with your wife!

## 2020-03-27 ENCOUNTER — Encounter: Payer: Self-pay | Admitting: Family Medicine

## 2020-03-27 MED ORDER — AZITHROMYCIN 250 MG PO TABS: ORAL_TABLET | ORAL | 0 refills | Status: DC

## 2020-04-10 ENCOUNTER — Other Ambulatory Visit: Payer: Self-pay | Admitting: Medical

## 2020-04-29 DIAGNOSIS — R634 Abnormal weight loss: Secondary | ICD-10-CM | POA: Diagnosis not present

## 2020-05-04 ENCOUNTER — Other Ambulatory Visit: Payer: Self-pay | Admitting: Medical

## 2020-05-17 ENCOUNTER — Telehealth: Payer: Self-pay | Admitting: Medical

## 2020-05-17 ENCOUNTER — Other Ambulatory Visit: Payer: Self-pay | Admitting: Medical

## 2020-05-17 NOTE — Telephone Encounter (Signed)
I sent his refill today.  Please make sure he is on the schedule for physical as his last physical visit was December 2020

## 2020-06-18 ENCOUNTER — Other Ambulatory Visit: Payer: Self-pay | Admitting: Medical

## 2020-07-16 ENCOUNTER — Telehealth: Payer: Self-pay | Admitting: Medical

## 2020-07-16 NOTE — Telephone Encounter (Signed)
Dismissal letter in guarantor snapshot  °

## 2020-07-19 ENCOUNTER — Other Ambulatory Visit: Payer: Self-pay | Admitting: Medical

## 2020-07-20 ENCOUNTER — Telehealth: Payer: Self-pay | Admitting: Medical

## 2020-07-20 ENCOUNTER — Other Ambulatory Visit: Payer: Self-pay | Admitting: Medical

## 2020-07-20 MED ORDER — ALPRAZOLAM 0.25 MG PO TABS: ORAL_TABLET | ORAL | 0 refills | Status: DC

## 2020-07-20 NOTE — Telephone Encounter (Signed)
Pt called and is requesting a a refill on his xanax pt was dismissed on 07/16/2020 pt has 30 days before we can not refill his medicines   [facility] to [id number] [place], [place] - [address] AT [facility]

## 2020-07-20 NOTE — Telephone Encounter (Signed)
Called and informed pt.  

## 2020-07-20 NOTE — Telephone Encounter (Signed)
done

## 2020-08-20 ENCOUNTER — Telehealth: Payer: Self-pay | Admitting: Medical

## 2020-08-20 ENCOUNTER — Other Ambulatory Visit: Payer: Self-pay | Admitting: Medical

## 2020-08-20 MED ORDER — ALPRAZOLAM 0.25 MG PO TABS: ORAL_TABLET | ORAL | 0 refills | Status: DC

## 2020-08-20 NOTE — Telephone Encounter (Signed)
Pt called requesting refill on xanax please refill pt is going to try and get back in with [place] this month talked to u about this please sendto  [id number] [place], [place] - [address] AT [facility]

## 2020-09-16 ENCOUNTER — Other Ambulatory Visit: Payer: Self-pay | Admitting: Medical

## 2020-09-17 ENCOUNTER — Telehealth: Payer: Self-pay | Admitting: Medical

## 2020-09-17 NOTE — Telephone Encounter (Signed)
Needs appt before addition refills . If last CX > 1 year ago, needs to be CPX appt

## 2020-09-17 NOTE — Telephone Encounter (Signed)
Pt needs [facility] [place]. Pt has a med check appt on 5/31

## 2020-09-19 ENCOUNTER — Other Ambulatory Visit: Payer: Self-pay | Admitting: Medical

## 2020-09-20 ENCOUNTER — Telehealth: Payer: Self-pay | Admitting: Medical

## 2020-09-20 NOTE — Telephone Encounter (Signed)
That is unacceptable.   He can keep the august CPX, but past due for follow up on medications.    He will need follow up virtual or in person for med check then now.   See recent notes in system as he was suppose to have f/u before additional refills.

## 2020-09-20 NOTE — Telephone Encounter (Signed)
Pt called and needs refill on xanax pt has a medcheck on may the 23rd please send to [id number] - [place], [place] - [address] AT [facility]

## 2020-09-20 NOTE — Telephone Encounter (Signed)
Got pt scheduled for fasting CPE in Aug. Pt needs refill on Xanax

## 2020-09-21 ENCOUNTER — Other Ambulatory Visit: Payer: Self-pay

## 2020-09-21 MED ORDER — ALPRAZOLAM 0.25 MG PO TABS: ORAL_TABLET | ORAL | 0 refills | Status: DC

## 2020-09-21 NOTE — Telephone Encounter (Signed)
Ok, I'll plan to do virtual med check with him

## 2020-09-21 NOTE — Telephone Encounter (Signed)
Pt has a virtual medcheck on 10/11/20 he moved it up from 10/19/20 and 05/23 was the soonest that you had,

## 2020-09-22 ENCOUNTER — Telehealth (INDEPENDENT_AMBULATORY_CARE_PROVIDER_SITE_OTHER): Payer: BC Managed Care – PPO | Admitting: Medical

## 2020-09-22 ENCOUNTER — Encounter: Payer: Self-pay | Admitting: Medical

## 2020-09-22 VITALS — Wt 150.0 lb

## 2020-09-22 DIAGNOSIS — F17200 Nicotine dependence, unspecified, uncomplicated: Secondary | ICD-10-CM

## 2020-09-22 DIAGNOSIS — F411 Generalized anxiety disorder: Secondary | ICD-10-CM

## 2020-09-22 DIAGNOSIS — Z566 Other physical and mental strain related to work: Secondary | ICD-10-CM | POA: Diagnosis not present

## 2020-09-22 NOTE — Progress Notes (Signed)
Subjective:     Patient ID: Vincent Walter, male   DOB: August 05, 1975, 45 y.o.   MRN: [id number]  This visit type was conducted due to national recommendations for restrictions regarding the COVID-19 Pandemic (e.g. social distancing) in an effort to limit this patient's exposure and mitigate transmission in our community.  Due to their co-morbid illnesses, this patient is at least at moderate risk for complications without adequate follow up.  This format is felt to be most appropriate for this patient at this time.    Documentation for virtual audio and video telecommunications through [place] encounter:  The patient was located at home. The provider was located in the office. The patient did consent to this visit and is aware of possible charges through their insurance for this visit.  The other persons participating in this telemedicine service were none. Time spent on call was 20 minutes and in review of previous records 20 minutes total.  This virtual service is not related to other E/M service within previous 7 days.    HPI Chief Complaint  Patient presents with  . other    Med check apt for refill xanax needs refill    Virtual consult regarding anxiety, med check.   He continues to use xanax once daily.  Sometimes doesn't use on the weekend.  Most of the takes a tablet on the way to work or mid morning.  Work tends to be his more stressful time.   Has lots of things going on as usual.   Septic field just ruptured at his house.  Is getting some exercise.   Works to get good sleep hygiene.  More active in the summer and less stressed in warmer weather months.    Was out of work some last year with shoulder surgery, injury.   Ended up changing jobs.  Had lots of bills piling up, fell behind on some things.     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   Has more home time now, more time with family compared to last job.  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    Prior medications included Citalopram 2017, paroxetine 2018, Effexor 2018.    No other aggravating or relieving factors. No other complaint.  Past Medical History:  Diagnosis Date  . Anxiety   . Chronic back pain   . Headache syndrome    related to anxiety  . Tobacco abuse   . Wears glasses    Current Outpatient Medications on File Prior to Visit  Medication Sig Dispense Refill  . ALPRAZolam (XANAX) 0.25 MG tablet TAKE 1 TABLET BY MOUTH EVERY DAY AS NEEDED FOR ANXIETY 20 tablet 0   No current facility-administered medications on file prior to visit.    Review of Systems As in subjective    Objective:   Physical Exam Due to coronavirus pandemic stay at home measures, patient visit was virtual and they were not examined in person.   Wt 150 lb (68 kg)   BMI 22.81 kg/m   Gen: wd, wn, nad Psych: pleasant, answers questions appropriately      Assessment:     Encounter Diagnoses  Name Primary?  . Generalized anxiety disorder Yes  . Smoker   . Work stress        Plan:      Counseled on ways to cope and deal with anxiety.  We discussed several strategies to deal with stress.  He does have a new job  which is less stressful and closer to home.   Discussed risk and benefits of medication  Of note prior medicines citalopram and paroxetine gave him significant sedation even at low doses.  Reviewed PDMP aware controlled substance reporting system.    Advised counseling   Ways to deal with stress and anxiety.  I recommend regular exercise such as 30 minutes or more most days of the week such as walking running and bicycling  I recommend taking some time to meditate or pray daily to help slow racing thoughts.  I recommend working on relaxation techniques such as deep breathing exercises in a comfortable position relaxing your body.  There are free Apps on the smart phone for this for example  Consider getting a massage regularly  Journal or use  diary to express your ideas on paper to cope with anxiety and stress  Work on time management, use a calendar or plan out things to avoid stressing about things. Find ways to utilize your time to include exercise and personal "me" time.  Some people use aromatherapy such as lavender to relax  Some people use herbal teas to help calm their mood  Spend some time with animals or your pet if you have one  Consider seeing a counselor to help deal with anxiety and work on specific techniques   Alika was seen today for other.  Diagnoses and all orders for this visit:  Generalized anxiety disorder  Smoker  Work stress  f/u for fasting physical

## 2020-09-23 ENCOUNTER — Encounter: Payer: BC Managed Care – PPO | Admitting: Medical

## 2020-10-07 ENCOUNTER — Other Ambulatory Visit: Payer: Self-pay | Admitting: Medical

## 2020-10-11 ENCOUNTER — Telehealth: Payer: BC Managed Care – PPO | Admitting: Medical

## 2020-10-19 ENCOUNTER — Telehealth: Payer: BC Managed Care – PPO | Admitting: Medical

## 2020-10-29 ENCOUNTER — Ambulatory Visit (INDEPENDENT_AMBULATORY_CARE_PROVIDER_SITE_OTHER): Payer: BC Managed Care – PPO | Admitting: Family Medicine

## 2020-10-29 ENCOUNTER — Encounter: Payer: Self-pay | Admitting: Family Medicine

## 2020-10-29 VITALS — BP 104/72 | HR 73 | Temp 97.3°F | Wt 150.0 lb

## 2020-10-29 DIAGNOSIS — Z2821 Immunization not carried out because of patient refusal: Secondary | ICD-10-CM

## 2020-10-29 DIAGNOSIS — M79642 Pain in left hand: Secondary | ICD-10-CM

## 2020-10-29 NOTE — Patient Instructions (Signed)
Take 2 Aleve twice per day for the next week.  If it gets worse or you get other joints involved then let us know

## 2020-10-29 NOTE — Progress Notes (Signed)
   Subjective:    Patient ID: Vincent Walter, male    DOB: November 30, 1975, 45 y.o.   MRN: [id number]  HPI He is here for evaluation of left hand pain.  It started last Friday with some discomfort and got worse with pain and swelling since Tuesday.  He points to the third and fourth fingers and some swelling on the dorsal surface.  No complaint of wrist elbow or knee pain.  He did try some colchicine given through his daughter where she works with no real benefit.  No new medications, no rashes.   Review of Systems     Objective:   Physical Exam Exam of the left hand does show questionable swelling of the left MCP third finger.  Other joints were nontender with no effusion.  Wrist shows no swelling.  Normal strength.   Medical record was reviewed and indicated no vaccination for COVID     Assessment & Plan:   Left hand pain  Immunization refused Take 2 Aleve twice per day for the next week.  If it gets worse or you get other joints involved then let us know I then discussed COVID vaccination, risks and benefits with him explaining in detail issues revolving getting the vaccine possible side effects, cure rate versus control rate.  He remains resistant to getting the immunization. Greater 30 minutes spent in counseling and coordination of care.

## 2020-11-12 ENCOUNTER — Ambulatory Visit (INDEPENDENT_AMBULATORY_CARE_PROVIDER_SITE_OTHER): Payer: Self-pay

## 2020-11-12 ENCOUNTER — Ambulatory Visit (HOSPITAL_COMMUNITY)
Admission: EM | Admit: 2020-11-12 | Discharge: 2020-11-12 | Disposition: A | Discharge status: Home / Self Care | Payer: Self-pay | Attending: Emergency Medicine | Admitting: Emergency Medicine

## 2020-11-12 ENCOUNTER — Telehealth: Payer: Self-pay | Admitting: Medical

## 2020-11-12 ENCOUNTER — Ambulatory Visit: Payer: BC Managed Care – PPO | Admitting: Family Medicine

## 2020-11-12 ENCOUNTER — Encounter (HOSPITAL_COMMUNITY): Payer: Self-pay

## 2020-11-12 DIAGNOSIS — M7989 Other specified soft tissue disorders: Secondary | ICD-10-CM

## 2020-11-12 DIAGNOSIS — M79642 Pain in left hand: Secondary | ICD-10-CM

## 2020-11-12 DIAGNOSIS — M25542 Pain in joints of left hand: Secondary | ICD-10-CM

## 2020-11-12 MED ORDER — MELOXICAM 7.5 MG PO TABS: 7.5000 mg | ORAL_TABLET | Freq: Every day | ORAL | 0 refills | Status: DC

## 2020-11-12 NOTE — Telephone Encounter (Signed)
Pt called and states that his left hand is still hurting and is swollen. He wants to know if he needs to go have a xray of it to see if there is something going on with it, States aleve is not helping with it Pt can be reached at [phone]

## 2020-11-12 NOTE — ED Triage Notes (Signed)
C/o Lt hand swelling on and off, started about a week and a half ago. Patient states he did not do anything that may has caused swelling. Some pain 3rd and 4th digit per patient (sharp aching pain). There is swelling at the knuckles, pain is 7/10. Has taken Aleve, patient says it tames the pain for a while. Able to bend fingers at knuckles, cap refills < 3 seconds.

## 2020-11-12 NOTE — Discharge Instructions (Addendum)
Take the meloxicam daily for pain and swelling.    Rest as much as possible Ice for 10-15 minutes every 4-6 hours as needed for pain and swelling You can try splints or ace bandages for comfort.   Elevate above your heart when sitting and laying down  Follow up with your primary care provider or sports medicine/orthopedics if symptoms do not improve in the next week.

## 2020-11-12 NOTE — Telephone Encounter (Signed)
Pt called back and he went to the urgent care to get xrays, and they told him he they where ok He wants to know you think a round of steroids would help, states you have mentioned  it when he was last time. [id number] [place], [place] - [address] AT [facility]  Please send to kim as I will not be here after 12.30

## 2020-11-12 NOTE — ED Provider Notes (Signed)
[facility]    CSN: [id number] Arrival date & time: 11/12/20  1013      History   Chief Complaint Chief Complaint  Patient presents with   Hand Pain    HPI Vincent Walter is a 45 y.o. male.   Patient here for evaluation of left hand pain and swelling that has been intermittent for the past week.  Reports being evaluated at primary care and was told it was likely arthritis and given ibuprofen.  Reports pain initially improved with the ibuprofen but has since gotten worse.  Reports pain is sharp and aching.  Denies any trauma, injury, or other precipitating event.  Denies any specific alleviating or aggravating factors.  Denies any fevers, chest pain, shortness of breath, N/V/D, numbness, tingling, weakness, abdominal pain, or headaches.     The history is provided by the patient.  Hand Pain   Past Medical History:  Diagnosis Date   Anxiety    Chronic back pain    Headache syndrome    related to anxiety   Tobacco abuse    Wears glasses     Patient Active Problem List   Diagnosis Date Noted   Work stress 09/22/2020   Chronic shoulder pain 01/16/2020   S/P shoulder surgery 01/16/2020   Need for influenza vaccination 05/15/2019   Acute pain of right shoulder 05/15/2019   Skin lesion 03/22/2018   Situational phobia 11/29/2016   Tension headache 07/05/2016   Routine general medical examination at a health care facility 03/29/2016   Smoker 03/29/2016   Vaccine counseling 03/29/2016   Generalized anxiety disorder 03/29/2016    Past Surgical History:  Procedure Laterality Date   MANDIBLE FRACTURE SURGERY         Home Medications    Prior to Admission medications   Medication Sig Start Date End Date Taking? Authorizing Provider  ALPRAZolam Prudy Feeler) 0.25 MG tablet TAKE 1 TABLET BY MOUTH EVERY DAY AS NEEDED FOR ANXIETY 10/08/20  Yes Tysinger, Kermit Balo, PA-C  meloxicam (MOBIC) 7.5 MG tablet Take 1 tablet (7.5 mg total) by mouth daily. 11/12/20  Yes Ivette Loyal, NP  COLCHICINE PO Take by mouth.    [provider]    Family History Family History  Problem Relation Age of Onset   Alcohol abuse Mother    Hypertension Father    Cancer Father        skin   Hyperlipidemia Brother    Hypertension Brother    Anxiety disorder Daughter    Cancer Paternal Grandfather    Heart disease Neg Hx    Other Neg Hx    Stroke Neg Hx    Diabetes Neg Hx     Social History Social History   Tobacco Use   Smoking status: Every Day    Packs/day: 1.00    Years: 27.00    Pack years: 27.00    Types: Cigarettes   Smokeless tobacco: Current  Vaping Use   Vaping Use: Never used  Substance Use Topics   Alcohol use: Yes    Alcohol/week: 7.0 standard drinks    Types: 7 Cans of beer per week    Comment: occasional   Drug use: No     Allergies   Patient has no known allergies.   Review of Systems Review of Systems  Musculoskeletal:  Positive for arthralgias and joint swelling.  All other systems reviewed and are negative.   Physical Exam Triage Vital Signs ED Triage Vitals  Enc Vitals Group  BP 11/12/20 1028 124/82     Pulse Rate 11/12/20 1028 87     Resp 11/12/20 1028 18     Temp 11/12/20 1028 98 F (36.7 C)     Temp Source 11/12/20 1028 Oral     SpO2 11/12/20 1028 99 %     Weight --      Height --      Head Circumference --      Peak Flow --      Pain Score 11/12/20 1029 7     Pain Loc --      Pain Edu? --      Excl. in GC? --    No data found.  Updated Vital Signs BP 124/82 (BP Location: Left Arm)   Pulse 87   Temp 98 F (36.7 C) (Oral)   Resp 18   SpO2 99%   Visual Acuity Right Eye Distance:   Left Eye Distance:   Bilateral Distance:    Right Eye Near:   Left Eye Near:    Bilateral Near:     Physical Exam Vitals and nursing note reviewed.  Constitutional:      General: He is not in acute distress.    Appearance: Normal appearance. He is not ill-appearing, toxic-appearing or diaphoretic.   HENT:     Head: Normocephalic and atraumatic.  Eyes:     Conjunctiva/sclera: Conjunctivae normal.  Cardiovascular:     Rate and Rhythm: Normal rate.     Pulses: Normal pulses.  Pulmonary:     Effort: Pulmonary effort is normal.  Abdominal:     General: Abdomen is flat.  Musculoskeletal:        General: Normal range of motion.     Right hand: Normal.     Left hand: Swelling, tenderness and bony tenderness present. Normal range of motion. Normal strength. Normal sensation. There is no disruption of two-point discrimination. Normal capillary refill.     Cervical back: Normal range of motion.  Skin:    General: Skin is warm and dry.  Neurological:     General: No focal deficit present.     Mental Status: He is alert and oriented to person, place, and time.  Psychiatric:        Mood and Affect: Mood normal.     UC Treatments / Results  Labs (all labs ordered are listed, but only abnormal results are displayed) Labs Reviewed - No data to display  EKG   Radiology DG Hand Complete Left  Result Date: 11/12/2020 CLINICAL DATA:  Pain and swelling EXAM: LEFT HAND - COMPLETE 3+ VIEW COMPARISON:  None. FINDINGS: There is no evidence of acute fracture or dislocation. Well corticated, likely accessory sesamoid along the volar aspect of the fourth digit PIP joint. There is no significant degenerative change. There are no osseous erosions. IMPRESSION: No acute osseous abnormality. Electronically Signed   By: Caprice Renshaw   On: 11/12/2020 11:17    Procedures Procedures (including critical care time)  Medications Ordered in UC Medications - No data to display  Initial Impression / Assessment and Plan / UC Course  I have reviewed the triage vital signs and the nursing notes.  Pertinent labs & imaging results that were available during my care of the patient were reviewed by me and considered in my medical decision making (see chart for details).    Assessment negative for red flags or  concerns.  X-ray with no acute osseous abnormality.  As he has tried ibuprofen and Aleve  with minimal relief will prescribe meloxicam daily.  Encourage rest, ice, and elevation when sitting or laying down.  May try splints or Ace bandages for comfort.  Recommend following up with primary care or orthopedics if symptoms do not improve in the next week. Final Clinical Impressions(s) / UC Diagnoses   Final diagnoses:  Left hand pain  Swelling of left hand     Discharge Instructions      Take the meloxicam daily for pain and swelling.    Rest as much as possible Ice for 10-15 minutes every 4-6 hours as needed for pain and swelling You can try splints or ace bandages for comfort.   Elevate above your heart when sitting and laying down  Follow up with your primary care provider or sports medicine/orthopedics if symptoms do not improve in the next week.      ED Prescriptions     Medication Sig Dispense Auth. Provider   meloxicam (MOBIC) 7.5 MG tablet Take 1 tablet (7.5 mg total) by mouth daily. 30 tablet Ivette Loyal, NP      PDMP not reviewed this encounter.   Ivette Loyal, NP 11/12/20 1134

## 2020-12-20 ENCOUNTER — Ambulatory Visit (INDEPENDENT_AMBULATORY_CARE_PROVIDER_SITE_OTHER): Payer: BC Managed Care – PPO | Admitting: Medical

## 2020-12-20 ENCOUNTER — Other Ambulatory Visit: Payer: Self-pay

## 2020-12-20 VITALS — BP 120/80 | HR 72 | Temp 97.2°F | Wt 147.2 lb

## 2020-12-20 DIAGNOSIS — M25542 Pain in joints of left hand: Secondary | ICD-10-CM

## 2020-12-20 DIAGNOSIS — M25442 Effusion, left hand: Secondary | ICD-10-CM

## 2020-12-20 MED ORDER — PREDNISONE 10 MG PO TABS: ORAL_TABLET | ORAL | 0 refills | Status: DC

## 2020-12-20 NOTE — Progress Notes (Signed)
Subjective:  Vincent Walter is a 45 y.o. male who presents for Chief Complaint  Patient presents with   left hand pain    Left hand pain in between 2 fingers x 2 months     Here for pain in left hand.  He was seen here a month or so for the same.  Denies injury fall or trauma.  He has been hurting on and off for several months.  Lately has been hurting every day.  He has pain and swelling particularly in the left middle and ring finger knuckles.  He denies any other joint issues.  No joint pain symmetrical on both sides of the body, no morning stiffness or fatigue, no other joint pain.  Primarily just pain and swelling in her left hand over those 2 fingers.  No bruising or discoloration.  He has tried Aleve, other NSAIDs.  They all helped a little but not totally making the pain and swelling go away.  He saw urgent care recently for the same and had an x-ray that did not show any abnormal findings.  He denies morning stiffness, no family history of rheumatoid conditions.  No history of gout.  He does drink some sugary drinks including beer regularly.  No other aggravating or relieving factors.    No other c/o.  Past Medical History:  Diagnosis Date   Anxiety    Chronic back pain    Headache syndrome    related to anxiety   Tobacco abuse    Wears glasses    Family History  Problem Relation Age of Onset   Alcohol abuse Mother    Hypertension Father    Cancer Father        skin   Hyperlipidemia Brother    Hypertension Brother    Anxiety disorder Daughter    Cancer Paternal Grandfather    Heart disease Neg Hx    Other Neg Hx    Stroke Neg Hx    Diabetes Neg Hx      The following portions of the patient's history were reviewed and updated as appropriate: allergies, current medications, past family history, past medical history, past social history, past surgical history and problem list.  ROS Otherwise as in subjective above    Objective: BP 120/80   Pulse 72   Temp (!)  97.2 F (36.2 C)   Wt 147 lb 3.2 oz (66.8 kg)   BMI 22.38 kg/m   General appearance: alert, no distress, well developed, well nourished Left hand tender over 3rd and 4th MCPs with slight swelling.  He cannot fully flex those 2 fingers but otherwise hand nontender without other swelling or deformity.  Rest of arms unremarkable Fingers arms and hands neurovascularly intact      Assessment: Encounter Diagnoses  Name Primary?   Arthralgia of left hand Yes   Finger joint swelling, left      Plan: We discussed symptoms and concerns.  I reviewed an x-ray in the chart record from June 24 that was normal of the left hand.  Given that this is going on for few months we will check labs regularly.  Begin a round of prednisone since he has failed other NSAIDs and other treatment so far.  We discussed rest, ice therapy/cool therapy, continue splint such as wrist and hand brace, elevation.  Follow-up pending labs  [name] was seen today for left hand pain.  Diagnoses and all orders for this visit:  Arthralgia of left hand -  Uric acid -     Sedimentation rate  Finger joint swelling, left -     Uric acid -     Sedimentation rate  Other orders -     predniSONE (DELTASONE) 10 MG tablet; 6/5/4/3/2/1 taper   Follow up: pending labs

## 2020-12-21 DIAGNOSIS — Z0000 Encounter for general adult medical examination without abnormal findings: Secondary | ICD-10-CM | POA: Diagnosis not present

## 2020-12-21 LAB — URIC ACID: Uric Acid: 6.8 mg/dL (ref 3.8–8.4)

## 2020-12-21 LAB — SEDIMENTATION RATE: Sed Rate: 3 mm/hr (ref 0–15)

## 2021-01-05 ENCOUNTER — Other Ambulatory Visit: Payer: Self-pay | Admitting: Medical

## 2021-01-14 ENCOUNTER — Other Ambulatory Visit: Payer: Self-pay

## 2021-01-14 ENCOUNTER — Encounter: Payer: Self-pay | Admitting: Medical

## 2021-01-14 ENCOUNTER — Ambulatory Visit (INDEPENDENT_AMBULATORY_CARE_PROVIDER_SITE_OTHER): Payer: BC Managed Care – PPO | Admitting: Medical

## 2021-01-14 VITALS — BP 120/70 | HR 62 | Temp 96.3°F | Ht 67.0 in | Wt 147.8 lb

## 2021-01-14 DIAGNOSIS — Z9889 Other specified postprocedural states: Secondary | ICD-10-CM | POA: Diagnosis not present

## 2021-01-14 DIAGNOSIS — L989 Disorder of the skin and subcutaneous tissue, unspecified: Secondary | ICD-10-CM | POA: Diagnosis not present

## 2021-01-14 DIAGNOSIS — R942 Abnormal results of pulmonary function studies: Secondary | ICD-10-CM | POA: Insufficient documentation

## 2021-01-14 DIAGNOSIS — F17200 Nicotine dependence, unspecified, uncomplicated: Secondary | ICD-10-CM

## 2021-01-14 DIAGNOSIS — Z13220 Encounter for screening for lipoid disorders: Secondary | ICD-10-CM

## 2021-01-14 DIAGNOSIS — Z1211 Encounter for screening for malignant neoplasm of colon: Secondary | ICD-10-CM

## 2021-01-14 DIAGNOSIS — F411 Generalized anxiety disorder: Secondary | ICD-10-CM | POA: Diagnosis not present

## 2021-01-14 DIAGNOSIS — Z0000 Encounter for general adult medical examination without abnormal findings: Secondary | ICD-10-CM | POA: Insufficient documentation

## 2021-01-14 DIAGNOSIS — F40248 Other situational type phobia: Secondary | ICD-10-CM | POA: Diagnosis not present

## 2021-01-14 DIAGNOSIS — Z7185 Encounter for immunization safety counseling: Secondary | ICD-10-CM

## 2021-01-14 DIAGNOSIS — Z122 Encounter for screening for malignant neoplasm of respiratory organs: Secondary | ICD-10-CM

## 2021-01-14 DIAGNOSIS — B070 Plantar wart: Secondary | ICD-10-CM

## 2021-01-14 LAB — LIPID PANEL
Chol/HDL Ratio: 3.1 ratio (ref 0.0–5.0)
Cholesterol, Total: 181 mg/dL (ref 100–199)
HDL: 58 mg/dL (ref >39)
LDL Chol Calc (NIH): 104 mg/dL — ABNORMAL HIGH (ref 0–99)
Triglycerides: 103 mg/dL (ref 0–149)
VLDL Cholesterol Cal: 19 mg/dL (ref 5–40)

## 2021-01-14 LAB — COMPREHENSIVE METABOLIC PANEL
ALT: 16 IU/L (ref 0–44)
AST: 18 IU/L (ref 0–40)
Albumin/Globulin Ratio: 2 (ref 1.2–2.2)
Albumin: 4.7 g/dL (ref 4.0–5.0)
Alkaline Phosphatase: 95 IU/L (ref 44–121)
BUN/Creatinine Ratio: 9 (ref 9–20)
BUN: 8 mg/dL (ref 6–24)
Bilirubin Total: 1.1 mg/dL (ref 0.0–1.2)
CO2: 23 mmol/L (ref 20–29)
Calcium: 9.9 mg/dL (ref 8.7–10.2)
Chloride: 101 mmol/L (ref 96–106)
Creatinine, Ser: 0.92 mg/dL (ref 0.76–1.27)
Globulin, Total: 2.4 g/dL (ref 1.5–4.5)
Glucose: 80 mg/dL (ref 65–99)
Potassium: 4.8 mmol/L (ref 3.5–5.2)
Sodium: 137 mmol/L (ref 134–144)
Total Protein: 7.1 g/dL (ref 6.0–8.5)
eGFR: 104 mL/min/{1.73_m2} (ref >59)

## 2021-01-14 LAB — CBC
Hematocrit: 50.3 % (ref 37.5–51.0)
Hemoglobin: 16.7 g/dL (ref 13.0–17.7)
MCH: 32.5 pg (ref 26.6–33.0)
MCHC: 33.2 g/dL (ref 31.5–35.7)
MCV: 98 fL — ABNORMAL HIGH (ref 79–97)
Platelets: 236 10*3/uL (ref 150–450)
RBC: 5.14 x10E6/uL (ref 4.14–5.80)
RDW: 13.7 % (ref 11.6–15.4)
WBC: 5.2 10*3/uL (ref 3.4–10.8)

## 2021-01-14 MED ORDER — BUPROPION HCL ER (XL) 150 MG PO TB24: 150.0000 mg | ORAL_TABLET | Freq: Every day | ORAL | 0 refills | Status: DC

## 2021-01-14 NOTE — Progress Notes (Signed)
Subjective:   HPI  Vincent Walter is a 45 y.o. male who presents for Chief Complaint  Patient presents with   Annual Exam    Fasting No other concerns    Patient Care Team: [facility], Cleda Mccreedy as PCP - General (Family Medicine) Sees dentist Sees eye doctor  Concerns: Doing fine.  His only concern today is a skin lesion or not on his right thumb has been there for several months.  He continues to smoke.  He started smoking at age 61 and smoking for 30 years.  Pack a day.    Reviewed their medical, surgical, family, social, medication, and allergy history and updated chart as appropriate.  Past Medical History:  Diagnosis Date   Anxiety    Chronic back pain    Headache syndrome    related to anxiety   Tobacco abuse    Wears glasses     Past Surgical History:  Procedure Laterality Date   MANDIBLE FRACTURE SURGERY      Family History  Problem Relation Age of Onset   Alcohol abuse Mother    Hypertension Father    Cancer Father        skin   Hyperlipidemia Brother    Hypertension Brother    Anxiety disorder Daughter    Cancer Paternal Grandfather    Heart disease Neg Hx    Other Neg Hx    Stroke Neg Hx    Diabetes Neg Hx      Current Outpatient Medications:    ALPRAZolam (XANAX) 0.25 MG tablet, TAKE 1 TABLET BY MOUTH EVERY DAY AS NEEDED FOR ANXIETY, Disp: 30 tablet, Rfl: 2   buPROPion (WELLBUTRIN XL) 150 MG 24 hr tablet, Take 1 tablet (150 mg total) by mouth daily., Disp: 90 tablet, Rfl: 0   meloxicam (MOBIC) 7.5 MG tablet, Take 1 tablet (7.5 mg total) by mouth daily. (Patient not taking: Reported on 01/14/2021), Disp: 30 tablet, Rfl: 0   predniSONE (DELTASONE) 10 MG tablet, 6/5/4/3/2/1 taper (Patient not taking: Reported on 01/14/2021), Disp: 21 tablet, Rfl: 0  No Known Allergies   Review of Systems Constitutional: -fever, -chills, -sweats, -unexpected weight change, -decreased appetite, -fatigue Allergy: -sneezing, -itching, -congestion Dermatology:  -changing moles, --rash, +lumps ENT: -runny nose, -ear pain, -sore throat, -hoarseness, -sinus pain, -teeth pain, - ringing in ears, -hearing loss, -nosebleeds Cardiology: -chest pain, -palpitations, -swelling, -difficulty breathing when lying flat, -waking up short of breath Respiratory: -cough, -shortness of breath, -difficulty breathing with exercise or exertion, -wheezing, -coughing up blood Gastroenterology: -abdominal pain, -nausea, -vomiting, -diarrhea, -constipation, -blood in stool, -changes in bowel movement, -difficulty swallowing or eating Hematology: -bleeding, -bruising  Musculoskeletal: -joint aches, -muscle aches, -joint swelling, -back pain, -neck pain, -cramping, -changes in gait Ophthalmology: denies vision changes, eye redness, itching, discharge Urology: -burning with urination, -difficulty urinating, -blood in urine, -urinary frequency, -urgency, -incontinence Neurology: -headache, -weakness, -tingling, -numbness, -memory loss, -falls, -dizziness Psychology: -depressed mood, -agitation, -sleep problems Male GU: no testicular mass, pain, no lymph nodes swollen, no swelling, no rash.  Depression screen [facility] 2/9 01/14/2021 10/29/2020 05/15/2019 09/04/2018 03/22/2018  Decreased Interest 0 0 0 0 0  Down, Depressed, Hopeless 0 0 0 0 0  PHQ - 2 Score 0 0 0 0 0        Objective:  BP 120/70   Pulse 62   Temp (!) 96.3 F (35.7 C)   Ht 5\' 7"  (1.702 m)   Wt 147 lb 12.8 oz (67 kg)   SpO2 98%  BMI 23.15 kg/m   General appearance: alert, no distress, WD/WN, Caucasian male Skin: Right thumb volar surface of distal phalanx with a 2 mm plantar wart, freckles throughout, scattered macules, no worrisome lesions HEENT: normocephalic, conjunctiva/corneas normal, sclerae anicteric, PERRLA, EOMi, nares patent, no discharge or erythema, pharynx normal Oral cavity: MMM, tongue normal, teeth normal Neck: Tattoo posterior neck, supple, no lymphadenopathy, no thyromegaly, no masses, normal  ROM, no bruits Chest: non tender, normal shape and expansion Heart: RRR, normal S1, S2, no murmurs Lungs: decreased lung sounds throughout, no wheezes, rhonchi, or rales Abdomen: Tattoo across upper abdomen, writing, +bs, soft, non tender, non distended, no masses, no hepatomegaly, no splenomegaly, no bruits Back: non tender, normal ROM, no scoliosis Musculoskeletal: upper extremities non tender, no obvious deformity, normal ROM throughout, lower extremities non tender, no obvious deformity, normal ROM throughout Extremities: no edema, no cyanosis, no clubbing Pulses: 2+ symmetric, upper and lower extremities, normal cap refill Neurological: alert, oriented x 3, CN2-12 intact, strength normal upper extremities and lower extremities, sensation normal throughout, DTRs 2+ throughout, no cerebellar signs, gait normal Psychiatric: normal affect, behavior normal, pleasant  GU: normal male external genitalia,circumcised, nontender, no masses, no hernia, no lymphadenopathy Rectal: deferred   Assessment and Plan :   Encounter Diagnoses  Name Primary?   Encounter for health maintenance examination in adult Yes   Routine general medical examination at a health care facility    Generalized anxiety disorder    Situational phobia    S/P shoulder surgery    Skin lesion    Smoker    Vaccine counseling    Screening for lipid disorders    Screening for lung cancer    Plantar wart    Screen for colon cancer     This visit was a preventative care visit, also known as wellness visit or routine physical.   Topics typically include healthy lifestyle, diet, exercise, preventative care, vaccinations, sick and well care, proper use of emergency dept and after hours care, as well as other concerns.     Recommendations: Continue to return yearly for your annual wellness and preventative care visits.  This gives [place] a chance to discuss healthy lifestyle, exercise, vaccinations, review your chart record, and  perform screenings where appropriate.  I recommend you see your eye doctor yearly for routine vision care.  I recommend you see your dentist yearly for routine dental care including hygiene visits twice yearly.   Vaccination recommendations were reviewed Immunization History  Administered Date(s) Administered   Influenza Split 02/23/2015   Influenza,inj,Quad PF,6+ Mos 05/15/2019   Influenza-Unspecified 02/24/2016, 03/16/2020   As we discussed, return soon for flu shot and updated Tdap tetanus diphtheria pertussis vaccine  You declined these today since she just got a bee sting a few days ago  Screening for cancer: Colon cancer screening: We will refer you for Cologuard   Testicular cancer screening You should do a monthly self testicular exam if you are between 54-32 years old  We discussed PSA, prostate exam, and prostate cancer screening risks/benefits.   Age 47  Skin cancer screening: Check your skin regularly for new changes, growing lesions, or other lesions of concern Come in for evaluation if you have skin lesions of concern.  Lung cancer screening: If you have a greater than 20 pack year history of tobacco use, then you may qualify for lung cancer screening with a chest CT scan.   Please call your insurance company to inquire about coverage for this test.  I placed an order for this test.  Expect a call from the scheduling department about this  We currently don't have screenings for other cancers besides breast, cervical, colon, and lung cancers.  If you have a strong family history of cancer or have other cancer screening concerns, please let me know.    Bone health: Get at least 150 minutes of aerobic exercise weekly Get weight bearing exercise at least once weekly Bone density test:  A bone density test is an imaging test that uses a type of X-ray to measure the amount of calcium and other minerals in your bones. The test may be used to diagnose or screen you for  a condition that causes weak or thin bones (osteoporosis), predict your risk for a broken bone (fracture), or determine how well your osteoporosis treatment is working. The bone density test is recommended for females 65 and older, or females or males <65 if certain risk factors such as thyroid disease, long term use of steroids such as for asthma or rheumatological issues, vitamin D deficiency, estrogen deficiency, family history of osteoporosis, self or family history of fragility fracture in first degree relative.    Heart health: Get at least 150 minutes of aerobic exercise weekly Limit alcohol It is important to maintain a healthy blood pressure and healthy cholesterol numbers  Heart disease screening: Screening for heart disease includes screening for blood pressure, fasting lipids, glucose/diabetes screening, BMI height to weight ratio, reviewed of smoking status, physical activity, and diet.    Goals include blood pressure 120/80 or less, maintaining a healthy lipid/cholesterol profile, preventing diabetes or keeping diabetes numbers under good control, not smoking or using tobacco products, exercising most days per week or at least 150 minutes per week of exercise, and eating healthy variety of fruits and vegetables, healthy oils, and avoiding unhealthy food choices like fried food, fast food, high sugar and high cholesterol foods.    Other tests may possibly include EKG test, CT coronary calcium score, echocardiogram, exercise treadmill stress test.     Medical care options: I recommend you continue to seek care here first for routine care.  We try really hard to have available appointments Monday through Friday daytime hours for sick visits, acute visits, and physicals.  Urgent care should be used for after hours and weekends for significant issues that cannot wait till the next day.  The emergency department should be used for significant potentially life-threatening emergencies.  The  emergency department is expensive, can often have long wait times for less significant concerns, so try to utilize primary care, urgent care, or telemedicine when possible to avoid unnecessary trips to the emergency department.  Virtual visits and telemedicine have been introduced since the pandemic started in 2020, and can be convenient ways to receive medical care.  We offer virtual appointments as well to assist you in a variety of options to seek medical care.   Separate significant issues discussed: Plantar wart-discussed cryotherapy risk and benefits.  Use cryotherapy for the small 2 mm plantar wart of the right thumb volar surface.  Discussed aftercare.  Tobacco use 30-pack-year history-strongly advised to quit smoking.  His 2017 PFT was abnormal.  His lung sounds are decreased.  I suspect he has some COPD already.  Begin trial of wellbutrin, advised 1-800-quit -now hotline.  Anxiety-doing okay on current therapy  Abnormal PFT - suspected COPD.   Advised smoking cessation, CT chest above.  Barbara CowerJason was seen today for annual exam.  Diagnoses and all  orders for this visit:  Encounter for health maintenance examination in adult -     Comprehensive metabolic panel -     CBC -     Lipid panel -     Urinalysis  Routine general medical examination at a health care facility  Generalized anxiety disorder  Situational phobia  S/P shoulder surgery  Skin lesion  Smoker -     Spirometry with graph  Vaccine counseling  Screening for lipid disorders -     Lipid panel  Screening for lung cancer -     CT CHEST LUNG CA SCREEN LOW DOSE W/O CM; Future  Plantar wart  Screen for colon cancer -     Cologuard  Other orders -     buPROPion (WELLBUTRIN XL) 150 MG 24 hr tablet; Take 1 tablet (150 mg total) by mouth daily.  Follow-up pending labs, yearly for physical

## 2021-01-14 NOTE — Patient Instructions (Signed)
This visit was a preventative care visit, also known as wellness visit or routine physical.   Topics typically include healthy lifestyle, diet, exercise, preventative care, vaccinations, sick and well care, proper use of emergency dept and after hours care, as well as other concerns.     Recommendations: Continue to return yearly for your annual wellness and preventative care visits.  This gives [place] a chance to discuss healthy lifestyle, exercise, vaccinations, review your chart record, and perform screenings where appropriate.  I recommend you see your eye doctor yearly for routine vision care.  I recommend you see your dentist yearly for routine dental care including hygiene visits twice yearly.   Vaccination recommendations were reviewed Immunization History  Administered Date(s) Administered   Influenza Split 02/23/2015   Influenza,inj,Quad PF,6+ Mos 05/15/2019   Influenza-Unspecified 02/24/2016, 03/16/2020   As we discussed, return soon for flu shot and updated Tdap tetanus diphtheria pertussis vaccine  You declined these today since she just got a bee sting a few days ago  Screening for cancer: Colon cancer screening: We will refer you for Cologuard   Testicular cancer screening You should do a monthly self testicular exam if you are between 54-70 years old  We discussed PSA, prostate exam, and prostate cancer screening risks/benefits.   Age 45  Skin cancer screening: Check your skin regularly for new changes, growing lesions, or other lesions of concern Come in for evaluation if you have skin lesions of concern.  Lung cancer screening: If you have a greater than 20 pack year history of tobacco use, then you may qualify for lung cancer screening with a chest CT scan.   Please call your insurance company to inquire about coverage for this test.  I placed an order for this test.  Expect a call from the scheduling department about this  We currently don't have screenings for  other cancers besides breast, cervical, colon, and lung cancers.  If you have a strong family history of cancer or have other cancer screening concerns, please let me know.    Bone health: Get at least 150 minutes of aerobic exercise weekly Get weight bearing exercise at least once weekly Bone density test:  A bone density test is an imaging test that uses a type of X-ray to measure the amount of calcium and other minerals in your bones. The test may be used to diagnose or screen you for a condition that causes weak or thin bones (osteoporosis), predict your risk for a broken bone (fracture), or determine how well your osteoporosis treatment is working. The bone density test is recommended for females 65 and older, or females or males <65 if certain risk factors such as thyroid disease, long term use of steroids such as for asthma or rheumatological issues, vitamin D deficiency, estrogen deficiency, family history of osteoporosis, self or family history of fragility fracture in first degree relative.    Heart health: Get at least 150 minutes of aerobic exercise weekly Limit alcohol It is important to maintain a healthy blood pressure and healthy cholesterol numbers  Heart disease screening: Screening for heart disease includes screening for blood pressure, fasting lipids, glucose/diabetes screening, BMI height to weight ratio, reviewed of smoking status, physical activity, and diet.    Goals include blood pressure 120/80 or less, maintaining a healthy lipid/cholesterol profile, preventing diabetes or keeping diabetes numbers under good control, not smoking or using tobacco products, exercising most days per week or at least 150 minutes per week of exercise, and eating  healthy variety of fruits and vegetables, healthy oils, and avoiding unhealthy food choices like fried food, fast food, high sugar and high cholesterol foods.    Other tests may possibly include EKG test, CT coronary calcium  score, echocardiogram, exercise treadmill stress test.     Medical care options: I recommend you continue to seek care here first for routine care.  We try really hard to have available appointments Monday through Friday daytime hours for sick visits, acute visits, and physicals.  Urgent care should be used for after hours and weekends for significant issues that cannot wait till the next day.  The emergency department should be used for significant potentially life-threatening emergencies.  The emergency department is expensive, can often have long wait times for less significant concerns, so try to utilize primary care, urgent care, or telemedicine when possible to avoid unnecessary trips to the emergency department.  Virtual visits and telemedicine have been introduced since the pandemic started in 2020, and can be convenient ways to receive medical care.  We offer virtual appointments as well to assist you in a variety of options to seek medical care.   Separate significant issues discussed: Plantar wart-discussed cryotherapy risk and benefits.  Use cryotherapy for the small 2 mm plantar wart of the right thumb volar surface.  Discussed aftercare.  Tobacco use 30-pack-year history-strongly advised to quit smoking.  His 2017 PFT was abnormal.  His lung sounds are decreased.  I suspect he has some COPD already.  Begin trial of wellbutrin, advised 1-800-quit -now hotline.  Anxiety-doing okay on current therapy  Abnormal PFT - suspected COPD.   Advised smoking cessation, CT chest above.

## 2021-01-15 LAB — URINALYSIS
Bilirubin, UA: NEGATIVE
Glucose, UA: NEGATIVE
Ketones, UA: NEGATIVE
Leukocytes,UA: NEGATIVE
Nitrite, UA: NEGATIVE
Protein,UA: NEGATIVE
RBC, UA: NEGATIVE
Specific Gravity, UA: 1.007 (ref 1.005–1.030)
Urobilinogen, Ur: 0.2 mg/dL (ref 0.2–1.0)
pH, UA: 7.5 (ref 5.0–7.5)

## 2021-01-17 ENCOUNTER — Telehealth: Payer: Self-pay | Admitting: Medical

## 2021-01-17 NOTE — Telephone Encounter (Signed)
Pt emailed cpe form to be filled out put your folder please return to me

## 2021-02-16 ENCOUNTER — Telehealth: Payer: Self-pay | Admitting: Medical

## 2021-02-16 NOTE — Telephone Encounter (Signed)
Apparently your insurance will not approve CT lung cancer screen since you are less than 46yo. We got note letting us know

## 2021-02-16 NOTE — Telephone Encounter (Signed)
Pt was notified.  

## 2021-03-04 DIAGNOSIS — R3129 Other microscopic hematuria: Secondary | ICD-10-CM | POA: Diagnosis not present

## 2021-03-04 DIAGNOSIS — Z87442 Personal history of urinary calculi: Secondary | ICD-10-CM | POA: Diagnosis not present

## 2021-04-06 ENCOUNTER — Other Ambulatory Visit: Payer: Self-pay | Admitting: Medical

## 2021-04-06 NOTE — Telephone Encounter (Signed)
Called pt because he had refilled on 01/04/21 with 2 refills.  He confirmed that he does take every day and needs refilled.  Last office visit 01/14/21

## 2021-04-17 ENCOUNTER — Other Ambulatory Visit: Payer: Self-pay | Admitting: Medical

## 2021-07-06 ENCOUNTER — Other Ambulatory Visit: Payer: Self-pay | Admitting: Medical

## 2021-07-27 ENCOUNTER — Telehealth: Payer: Self-pay | Admitting: Medical

## 2021-07-27 NOTE — Telephone Encounter (Signed)
Pt called and is requesting a refill to get his xanax sooner, he has 5 left, he has had to take 2 on some days pt uses [id number] - [place], [place] - [address] AT [facility] ?

## 2021-07-28 NOTE — Telephone Encounter (Signed)
Pt. Aware said he didn't really understand. He said he has not ever asked for a refill early on the xanax before and has been on it for 5 years. I told him it was too early to refill and it has 2 refills on it. He said that's ok and he would try to find a new doctor anyway. He said he had increased it to 2 daily because he had been having a rough time lately and that's why he had run out early.  ?

## 2021-07-28 NOTE — Telephone Encounter (Signed)
Called pt - LM.

## 2021-09-19 DIAGNOSIS — H01004 Unspecified blepharitis left upper eyelid: Secondary | ICD-10-CM | POA: Diagnosis not present

## 2021-09-19 DIAGNOSIS — H40013 Open angle with borderline findings, low risk, bilateral: Secondary | ICD-10-CM | POA: Diagnosis not present

## 2021-09-19 DIAGNOSIS — H01002 Unspecified blepharitis right lower eyelid: Secondary | ICD-10-CM | POA: Diagnosis not present

## 2021-09-19 DIAGNOSIS — H01001 Unspecified blepharitis right upper eyelid: Secondary | ICD-10-CM | POA: Diagnosis not present

## 2021-10-07 ENCOUNTER — Other Ambulatory Visit: Payer: Self-pay | Admitting: Medical

## 2021-10-07 NOTE — Telephone Encounter (Signed)
[facility] is requesting to [facility]. Please advise [facility]

## 2021-10-12 ENCOUNTER — Telehealth (INDEPENDENT_AMBULATORY_CARE_PROVIDER_SITE_OTHER): Payer: BC Managed Care – PPO | Admitting: Medical

## 2021-10-12 ENCOUNTER — Encounter (INDEPENDENT_AMBULATORY_CARE_PROVIDER_SITE_OTHER): Payer: Self-pay | Admitting: *Deleted

## 2021-10-12 ENCOUNTER — Encounter: Payer: Self-pay | Admitting: Medical

## 2021-10-12 VITALS — Wt 145.0 lb

## 2021-10-12 DIAGNOSIS — Z1211 Encounter for screening for malignant neoplasm of colon: Secondary | ICD-10-CM

## 2021-10-12 DIAGNOSIS — K219 Gastro-esophageal reflux disease without esophagitis: Secondary | ICD-10-CM

## 2021-10-12 DIAGNOSIS — K30 Functional dyspepsia: Secondary | ICD-10-CM | POA: Diagnosis not present

## 2021-10-12 MED ORDER — OMEPRAZOLE 40 MG PO CPDR: 40.0000 mg | DELAYED_RELEASE_CAPSULE | Freq: Two times a day (BID) | ORAL | 1 refills | Status: DC

## 2021-10-12 NOTE — Progress Notes (Signed)
Subjective:     Patient ID: Vincent Walter, male   DOB: [birth year], 46 y.o.   MRN: [id number]  This visit type was conducted due to national recommendations for restrictions regarding the COVID-19 Pandemic (e.g. social distancing) in an effort to limit this patient's exposure and mitigate transmission in our community.  Due to their co-morbid illnesses, this patient is at least at moderate risk for complications without adequate follow up.  This format is felt to be most appropriate for this patient at this time.    Documentation for virtual audio and video telecommunications through [place] encounter:  The patient was located at home. The provider was located in the office. The patient did consent to this visit and is aware of possible charges through their insurance for this visit.  The other persons participating in this telemedicine service were none. Time spent on call was 20 minutes and in review of previous records 20 minutes total.  This virtual service is not related to other E/M service within previous 7 days.   HPI Chief Complaint  Patient presents with   Gastroesophageal Reflux    Bad reflux for the last 2 months. Everyday    Virtual consult for acid reflux.  He is status reflux in the past but lately he is much worse.  In the last 2 months, everytihng he drinks or eats, getting bad reflux and indigestion.  Lately every having these symptoms.   Had been using Tums and Pepto-Bismol, but purchased prilosec today.  Did have some quesadilla yesterday, buffalo sauce last night which really got things flared up this morning.  In general he has eaten a variety of foods including acidic foods.  Lately afraid to eat anything.  No vomiting after eating. But has felt sensation of fullness in throat.  Does drink some alcohol.  Been drinking a beer or 2 when he comes home from work sometimes.  No black stool, no blood in stool.  No prior stomach ulcer.  No prior EGD, no prior colonoscopy.   Noo   Past Medical History:  Diagnosis Date   Anxiety    Chronic back pain    Headache syndrome    related to anxiety   Tobacco abuse    Wears glasses    Current Outpatient Medications on File Prior to Visit  Medication Sig Dispense Refill   ALPRAZolam (XANAX) 0.25 MG tablet TAKE 1 TABLET BY MOUTH EVERY DAY AS NEEDED FOR ANXIETY 30 tablet 1   No current facility-administered medications on file prior to visit.   Past Surgical History:  Procedure Laterality Date   MANDIBLE FRACTURE SURGERY       Review of Systems As in subjective    Objective:   Physical Exam Due to coronavirus pandemic stay at home measures, patient visit was virtual and they were not examined in person.   Wt 145 lb (65.8 kg)   BMI 22.71 kg/m   Gen: wd, wn,nad       Assessment:     Encounter Diagnoses  Name Primary?   Gastroesophageal reflux disease, unspecified whether esophagitis present Yes   Indigestion    Screen for colon cancer        Plan:     We discussed symptoms and concerns of possible differential.  Symptoms do suggest GERD versus possible ulcer.  Advise he avoid GERD triggers.  We discussed the long list of possible GERD triggers.  Avoid eating close to bedtime, avoid rushing through meal.  Drink plenty of water throughout the  day.  Begin omeprazole twice daily for the first week then once daily.  Use Pepto-Bismol once a day at a different time of day for the next week.  Antacids as needed over-the-counter.  If not seeing some improvement within the next 3 to 5 days call back as we may consider adding sucralfate.   We will go ahead and refer to gastroenterology for this concern and follow-up as well as the need for baseline colon cancer screening.  Coal was seen today for gastroesophageal reflux.  Diagnoses and all orders for this visit:  Gastroesophageal reflux disease, unspecified whether esophagitis present -     Ambulatory referral to Gastroenterology  Indigestion -      Ambulatory referral to Gastroenterology  Screen for colon cancer -     Ambulatory referral to Gastroenterology  Other orders -     omeprazole (PRILOSEC) 40 MG capsule; Take 1 capsule (40 mg total) by mouth in the morning and at bedtime.  F/u pending referral

## 2021-10-31 IMAGING — CR DG SHOULDER 2+V*R*
3 series · 3 of 3 positions shown · non-contrast
Comparison: None.

CLINICAL DATA: Pain, limited range of motion

EXAM:
RIGHT SHOULDER - 2+ VIEW

[w shoulder grashey right]
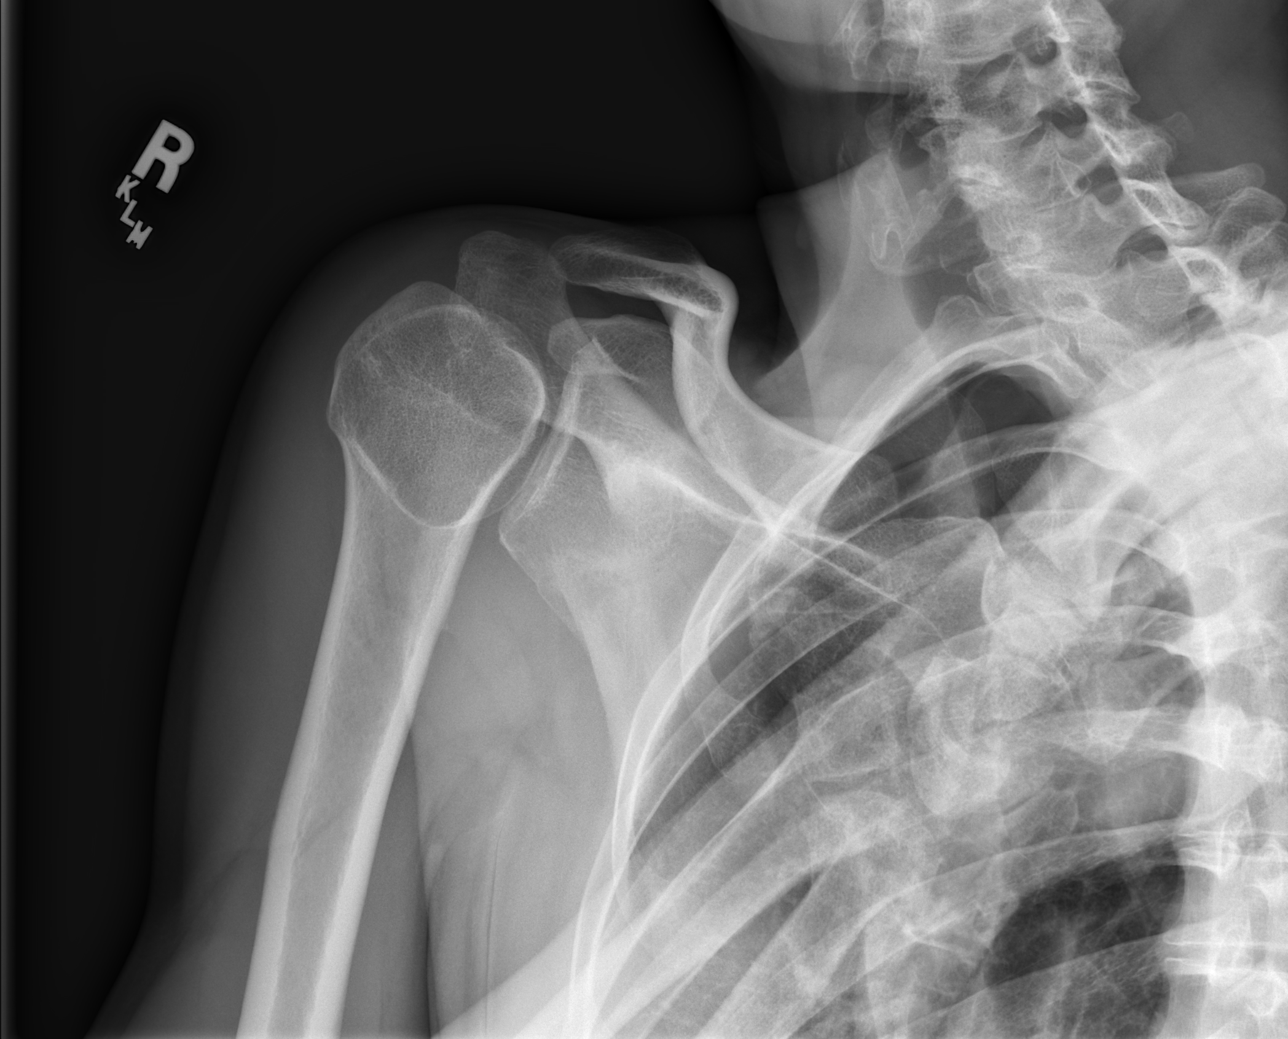

[w shoulder y-view right]
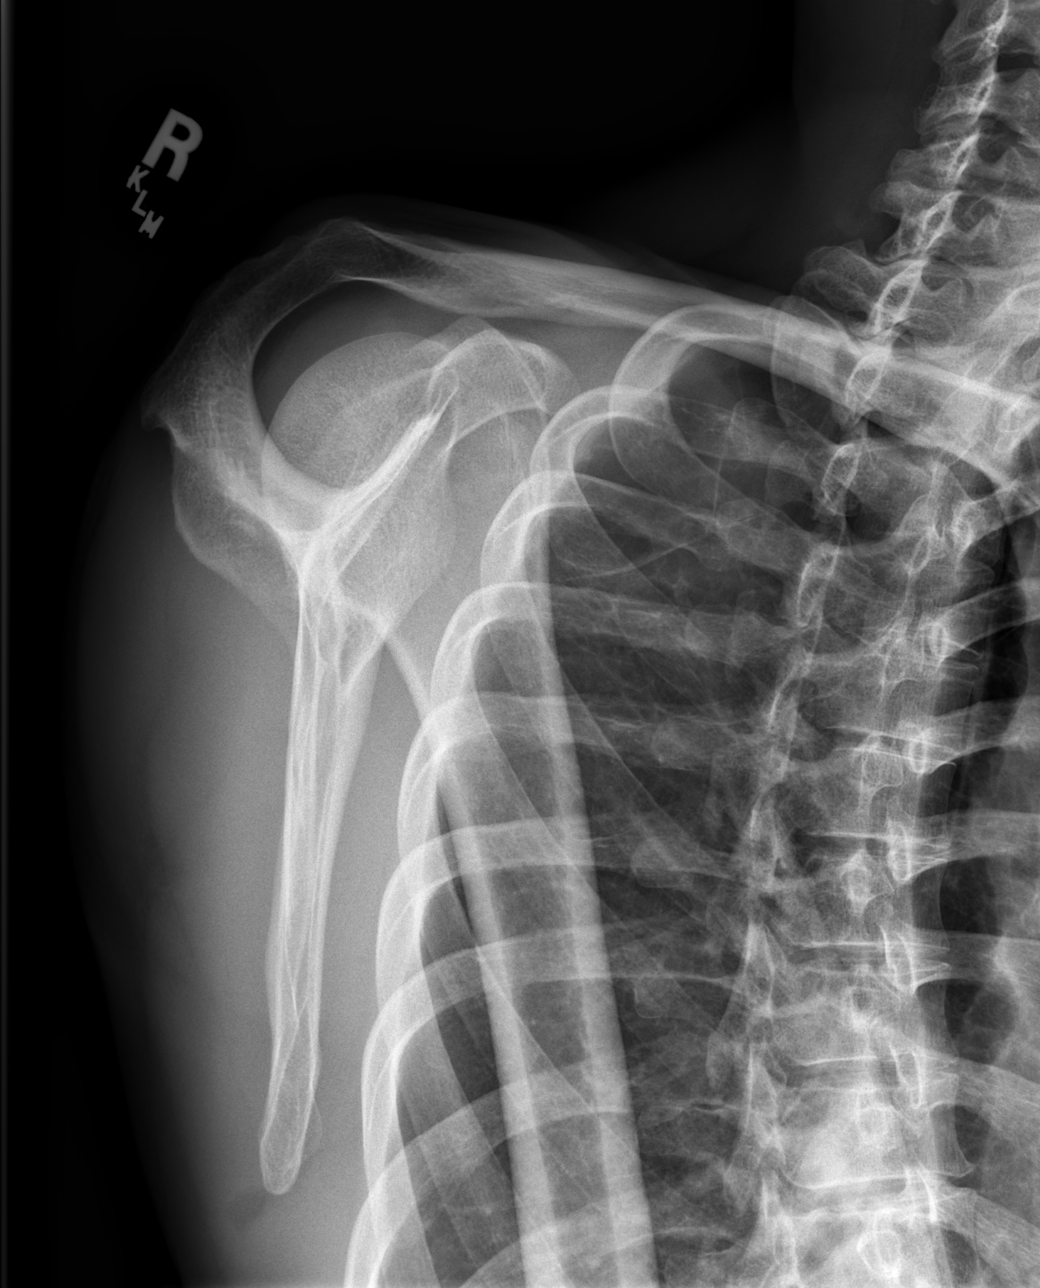

[x shoulder axillary right]
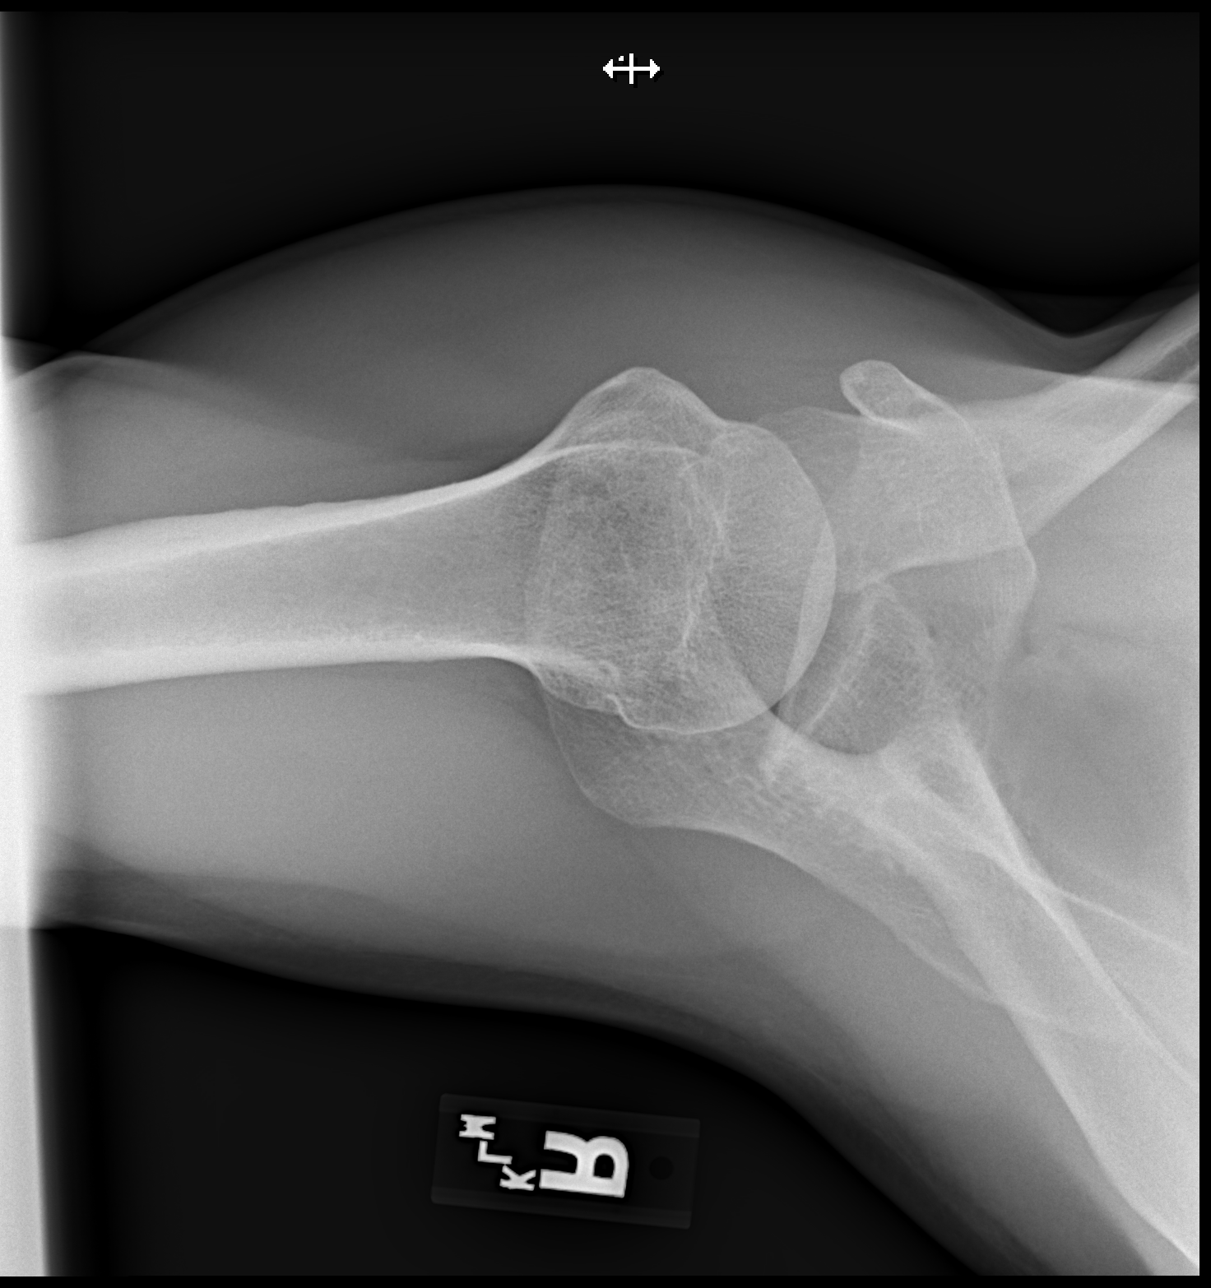

[3 of 3 positions shown; findings below may reference images not displayed]

FINDINGS: Frontal, transscapular, and axillary views of the right shoulder are
obtained. There is mild glenohumeral joint space narrowing. No
fracture, subluxation, or dislocation. Right chest is clear.
IMPRESSION: 1. Glenohumeral osteoarthritis.  No fracture.

## 2021-11-03 ENCOUNTER — Other Ambulatory Visit: Payer: Self-pay | Admitting: Medical

## 2021-12-02 ENCOUNTER — Other Ambulatory Visit: Payer: Self-pay | Admitting: Medical

## 2021-12-08 ENCOUNTER — Other Ambulatory Visit (INDEPENDENT_AMBULATORY_CARE_PROVIDER_SITE_OTHER): Payer: Self-pay

## 2021-12-08 ENCOUNTER — Ambulatory Visit (INDEPENDENT_AMBULATORY_CARE_PROVIDER_SITE_OTHER): Payer: BC Managed Care – PPO | Admitting: Gastroenterology

## 2021-12-08 ENCOUNTER — Encounter (INDEPENDENT_AMBULATORY_CARE_PROVIDER_SITE_OTHER): Payer: Self-pay | Admitting: Gastroenterology

## 2021-12-08 ENCOUNTER — Encounter (INDEPENDENT_AMBULATORY_CARE_PROVIDER_SITE_OTHER): Payer: Self-pay

## 2021-12-08 VITALS — BP 131/84 | HR 64 | Temp 97.5°F | Ht 67.0 in | Wt 149.5 lb

## 2021-12-08 DIAGNOSIS — K219 Gastro-esophageal reflux disease without esophagitis: Secondary | ICD-10-CM | POA: Diagnosis not present

## 2021-12-08 NOTE — Progress Notes (Signed)
Kenecia Barren Castaneda, M.D. Gastroenterology & Hepatology [facility] [address] [place], [place] [address] Primary Care Physician: Tysinger, David S, PA-C [address] [place] [address]  Referring MD: PCP  Chief Complaint:  GERD  History of Present Illness: Vincent Walter is a 46 y.o. male with past medical history of GERD, anxiety, tobacco abuse, who presents for evaluation of GERD.  Patient reports that roughly 10 years ago he noticed new onset of episodes of heartburn and regurgitation. He stated these episodes were happening infreqeuntly as they were present mostly when he drank with a straw. He states he never took medicines for these episodes. He has noticed for the last 5 years he was presenting recurring episodes of heartburn and was waking up in the middle of the night due to the heartburn and acidic taste in his chest, along with feeling with recurrent discomfort in his neck. States three months ago he was prescribed omeprazole 40 mg twice a day for a month by his PCP. He felt improvement with heartburn but he reported having persistent discomfort in his neck. Now he is taking omeprazole 40 mg once a day, does not take it at a specific time of the day but depending on how he is feeling - he has felt some of the heartburn is back.  He had some occasional episodes of nausea and vomiting.  Denies any odynophagia or dysphagia.  The patient denies having any nausea, vomiting, fever, chills, hematochezia, melena, hematemesis, abdominal distention, abdominal pain, diarrhea, jaundice, pruritus or weight loss.  Last EGD:never Last Colonoscopy:never, has a Cologuard a test pending  FHx: neg for any gastrointestinal/liver disease, father skin cancer Social: smokes 1 pack day, drinks couple of beers every 2-3 beers a day, neg illicit drug use Surgical: no abdominal surgeries  Past Medical History: Past Medical History:  Diagnosis  Date   Anxiety    Chronic back pain    Headache syndrome    related to anxiety   Tobacco abuse    Wears glasses     Past Surgical History: Past Surgical History:  Procedure Laterality Date   MANDIBLE FRACTURE SURGERY      Family History: Family History  Problem Relation Age of Onset   Alcohol abuse Mother    Hypertension Father    Cancer Father        skin   Hyperlipidemia Brother    Hypertension Brother    Anxiety disorder Daughter    Cancer Paternal Grandfather    Heart disease Neg Hx    Other Neg Hx    Stroke Neg Hx    Diabetes Neg Hx     Social History: Social History   Tobacco Use  Smoking Status Every Day   Packs/day: 1.00   Years: 27.00   Total pack years: 27.00   Types: Cigarettes  Smokeless Tobacco Current   Social History   Substance and Sexual Activity  Alcohol Use Yes   Alcohol/week: 7.0 standard drinks of alcohol   Types: 7 Cans of beer per week   Comment: occasional   Social History   Substance and Sexual Activity  Drug Use No    Allergies: No Known Allergies  Medications: Current Outpatient Medications  Medication Sig Dispense Refill   ALPRAZolam (XANAX) 0.25 MG tablet TAKE 1 TABLET BY MOUTH EVERY DAY AS NEEDED FOR ANXIETY 30 tablet 1   omeprazole (PRILOSEC) 40 MG capsule TAKE 1 CAPSULE (40 MG TOTAL) BY MOUTH IN THE MORNING AND AT   BEDTIME. 60 capsule 1   No current facility-administered medications for this visit.    Review of Systems: GENERAL: negative for malaise, night sweats HEENT: No changes in hearing or vision, no nose bleeds or other nasal problems. NECK: Negative for lumps, goiter, pain and significant neck swelling RESPIRATORY: Negative for cough, wheezing CARDIOVASCULAR: Negative for chest pain, leg swelling, palpitations, orthopnea GI: SEE HPI MUSCULOSKELETAL: Negative for joint pain or swelling, back pain, and muscle pain. SKIN: Negative for lesions, rash PSYCH: Negative for sleep disturbance, mood disorder and  recent psychosocial stressors. HEMATOLOGY Negative for prolonged bleeding, bruising easily, and swollen nodes. ENDOCRINE: Negative for cold or heat intolerance, polyuria, polydipsia and goiter. NEURO: negative for tremor, gait imbalance, syncope and seizures. The remainder of the review of systems is noncontributory.   Physical Exam: BP 131/84 (BP Location: Left Arm, Patient Position: Sitting, Cuff Size: Small)   Pulse 64   Temp (!) 97.5 F (36.4 C) (Oral)   Ht 5\' 7"  (1.702 m)   Wt 149 lb 8 oz (67.8 kg)   BMI 23.42 kg/m  GENERAL: The patient is AO x3, in no acute distress. HEENT: Head is normocephalic and atraumatic. EOMI are intact. Mouth is well hydrated and without lesions. NECK: Supple. No masses LUNGS: Clear to auscultation. No presence of rhonchi/wheezing/rales. Adequate chest expansion HEART: RRR, normal s1 and s2. ABDOMEN: Soft, nontender, no guarding, no peritoneal signs, and nondistended. BS +. No masses. EXTREMITIES: Without any cyanosis, clubbing, rash, lesions or edema. NEUROLOGIC: AOx3, no focal motor deficit. SKIN: no jaundice, no rashes   Imaging/Labs: as above  I personally reviewed and interpreted the available labs, imaging and endoscopic files.  Impression and Plan: Vincent Walter is a 46 y.o. male with past medical history of GERD, anxiety, tobacco abuse, who presents for evaluation of GERD.  Patient has presented improvement of his symptoms while taking omeprazole 40 mg every day although is still presenting some discomfort in his throat.  He has not been able to stop the medication as this will lead to recurrence of his GERD related symptoms.  The patient and I held a thorough discussion about potential nonpharmacologic treatments for reflux which include Transoral Incisionless Fundoplication (TIF) and Nissen fundoplication.  The benefits and risks, as well as prognosis with the use of the different modalities was thoroughly discussed with the patient who  understood and agreed.  The patient will read more about these procedures and will let me know if interested to pursue this in the future.  For now, we will explore the symptoms further with an EGD.  He will continue with omeprazole 40 mg every day and he will make lifestyle modifications to improve his symptoms further.  Finally, he will proceed with his Cologuard testing.  We agreed to proceed with a colonoscopy if the Cologuard is positive.  - Schedule EGD  - Continue omeprazole 40 mg qday -Explained presumed etiology of reflux symptoms. Instruction provided in the use of antireflux medication - patient should take medication in the morning 30-45 minutes before eating breakfast. Discussed avoidance of eating within 2 hours of lying down to sleep and benefit of blocks to elevate head of bed. Also, will benefit from avoiding carbonated drinks/sodas or food that has tomatoes, spicy or greasy food. - Proceed with Cologuard testing  All questions were answered.      48, MD Gastroenterology and Hepatology [facility]

## 2021-12-08 NOTE — Patient Instructions (Signed)
Schedule EGD  Continue omeprazole 40 mg qday Explained presumed etiology of reflux symptoms. Instruction provided in the use of antireflux medication - patient should take medication in the morning 30-45 minutes before eating breakfast. Discussed avoidance of eating within 2 hours of lying down to sleep and benefit of blocks to elevate head of bed. Also, will benefit from avoiding carbonated drinks/sodas or food that has tomatoes, spicy or greasy food. Proceed with Cologuard testing

## 2021-12-08 NOTE — H&P (View-Only) (Signed)
Vincent Walter, M.D. Gastroenterology & Hepatology [facility] [address] [place], [place] [address] Primary Care Physician: Genia Del [address] [place] [address]  Referring MD: PCP  Chief Complaint:  GERD  History of Present Illness: Vincent Walter is a 46 y.o. male with past medical history of GERD, anxiety, tobacco abuse, who presents for evaluation of GERD.  Patient reports that roughly 10 years ago he noticed new onset of episodes of heartburn and regurgitation. He stated these episodes were happening infreqeuntly as they were present mostly when he drank with a straw. He states he never took medicines for these episodes. He has noticed for the last 5 years he was presenting recurring episodes of heartburn and was waking up in the middle of the night due to the heartburn and acidic taste in his chest, along with feeling with recurrent discomfort in his neck. States three months ago he was prescribed omeprazole 40 mg twice a day for a month by his PCP. He felt improvement with heartburn but he reported having persistent discomfort in his neck. Now he is taking omeprazole 40 mg once a day, does not take it at a specific time of the day but depending on how he is feeling - he has felt some of the heartburn is back.  He had some occasional episodes of nausea and vomiting.  Denies any odynophagia or dysphagia.  The patient denies having any nausea, vomiting, fever, chills, hematochezia, melena, hematemesis, abdominal distention, abdominal pain, diarrhea, jaundice, pruritus or weight loss.  Last FOY:DXAJO Last Colonoscopy:never, has a Cologuard a test pending  FHx: neg for any gastrointestinal/liver disease, father skin cancer Social: smokes 1 pack day, drinks couple of beers every 2-3 beers a day, neg illicit drug use Surgical: no abdominal surgeries  Past Medical History: Past Medical History:  Diagnosis  Date   Anxiety    Chronic back pain    Headache syndrome    related to anxiety   Tobacco abuse    Wears glasses     Past Surgical History: Past Surgical History:  Procedure Laterality Date   MANDIBLE FRACTURE SURGERY      Family History: Family History  Problem Relation Age of Onset   Alcohol abuse Mother    Hypertension Father    Cancer Father        skin   Hyperlipidemia Brother    Hypertension Brother    Anxiety disorder Daughter    Cancer Paternal Grandfather    Heart disease Neg Hx    Other Neg Hx    Stroke Neg Hx    Diabetes Neg Hx     Social History: Social History   Tobacco Use  Smoking Status Every Day   Packs/day: 1.00   Years: 27.00   Total pack years: 27.00   Types: Cigarettes  Smokeless Tobacco Current   Social History   Substance and Sexual Activity  Alcohol Use Yes   Alcohol/week: 7.0 standard drinks of alcohol   Types: 7 Cans of beer per week   Comment: occasional   Social History   Substance and Sexual Activity  Drug Use No    Allergies: No Known Allergies  Medications: Current Outpatient Medications  Medication Sig Dispense Refill   ALPRAZolam (XANAX) 0.25 MG tablet TAKE 1 TABLET BY MOUTH EVERY DAY AS NEEDED FOR ANXIETY 30 tablet 1   omeprazole (PRILOSEC) 40 MG capsule TAKE 1 CAPSULE (40 MG TOTAL) BY MOUTH IN THE MORNING AND AT  BEDTIME. 60 capsule 1   No current facility-administered medications for this visit.    Review of Systems: GENERAL: negative for malaise, night sweats HEENT: No changes in hearing or vision, no nose bleeds or other nasal problems. NECK: Negative for lumps, goiter, pain and significant neck swelling RESPIRATORY: Negative for cough, wheezing CARDIOVASCULAR: Negative for chest pain, leg swelling, palpitations, orthopnea GI: SEE HPI MUSCULOSKELETAL: Negative for joint pain or swelling, back pain, and muscle pain. SKIN: Negative for lesions, rash PSYCH: Negative for sleep disturbance, mood disorder and  recent psychosocial stressors. HEMATOLOGY Negative for prolonged bleeding, bruising easily, and swollen nodes. ENDOCRINE: Negative for cold or heat intolerance, polyuria, polydipsia and goiter. NEURO: negative for tremor, gait imbalance, syncope and seizures. The remainder of the review of systems is noncontributory.   Physical Exam: BP 131/84 (BP Location: Left Arm, Patient Position: Sitting, Cuff Size: Small)   Pulse 64   Temp (!) 97.5 F (36.4 C) (Oral)   Ht 5\' 7"  (1.702 m)   Wt 149 lb 8 oz (67.8 kg)   BMI 23.42 kg/m  GENERAL: The patient is AO x3, in no acute distress. HEENT: Head is normocephalic and atraumatic. EOMI are intact. Mouth is well hydrated and without lesions. NECK: Supple. No masses LUNGS: Clear to auscultation. No presence of rhonchi/wheezing/rales. Adequate chest expansion HEART: RRR, normal s1 and s2. ABDOMEN: Soft, nontender, no guarding, no peritoneal signs, and nondistended. BS +. No masses. EXTREMITIES: Without any cyanosis, clubbing, rash, lesions or edema. NEUROLOGIC: AOx3, no focal motor deficit. SKIN: no jaundice, no rashes   Imaging/Labs: as above  I personally reviewed and interpreted the available labs, imaging and endoscopic files.  Impression and Plan: [name] is a 46 y.o. male with past medical history of GERD, anxiety, tobacco abuse, who presents for evaluation of GERD.  Patient has presented improvement of his symptoms while taking omeprazole 40 mg every day although is still presenting some discomfort in his throat.  He has not been able to stop the medication as this will lead to recurrence of his GERD related symptoms.  The patient and I held a thorough discussion about potential nonpharmacologic treatments for reflux which include Transoral Incisionless Fundoplication (TIF) and Nissen fundoplication.  The benefits and risks, as well as prognosis with the use of the different modalities was thoroughly discussed with the patient who  understood and agreed.  The patient will read more about these procedures and will let me know if interested to pursue this in the future.  For now, we will explore the symptoms further with an EGD.  He will continue with omeprazole 40 mg every day and he will make lifestyle modifications to improve his symptoms further.  Finally, he will proceed with his Cologuard testing.  We agreed to proceed with a colonoscopy if the Cologuard is positive.  - Schedule EGD  - Continue omeprazole 40 mg qday -Explained presumed etiology of reflux symptoms. Instruction provided in the use of antireflux medication - patient should take medication in the morning 30-45 minutes before eating breakfast. Discussed avoidance of eating within 2 hours of lying down to sleep and benefit of blocks to elevate head of bed. Also, will benefit from avoiding carbonated drinks/sodas or food that has tomatoes, spicy or greasy food. - Proceed with Cologuard testing  All questions were answered.      48, MD Gastroenterology and Hepatology [facility]

## 2021-12-12 ENCOUNTER — Encounter (HOSPITAL_COMMUNITY): Payer: Self-pay

## 2021-12-12 ENCOUNTER — Other Ambulatory Visit: Payer: Self-pay | Admitting: Medical

## 2021-12-13 ENCOUNTER — Encounter (HOSPITAL_COMMUNITY)
Admission: RE | Admit: 2021-12-13 | Discharge: 2021-12-13 | Disposition: A | Discharge status: Home / Self Care | Payer: BC Managed Care – PPO | Source: Ambulatory Visit | Attending: Gastroenterology | Admitting: Gastroenterology

## 2021-12-13 HISTORY — DX: Gastro-esophageal reflux disease without esophagitis: K21.9

## 2021-12-16 ENCOUNTER — Ambulatory Visit (HOSPITAL_COMMUNITY)
Admission: RE | Admit: 2021-12-16 | Discharge: 2021-12-16 | Disposition: A | Discharge status: Home / Self Care | Payer: BC Managed Care – PPO | Attending: Gastroenterology | Admitting: Gastroenterology

## 2021-12-16 ENCOUNTER — Encounter (HOSPITAL_COMMUNITY): Payer: Self-pay | Admitting: Gastroenterology

## 2021-12-16 ENCOUNTER — Ambulatory Visit (HOSPITAL_COMMUNITY): Payer: BC Managed Care – PPO | Admitting: Certified Registered"

## 2021-12-16 ENCOUNTER — Encounter (HOSPITAL_COMMUNITY)
Admission: RE | Disposition: A | Discharge status: Home / Self Care | Payer: Self-pay | Source: Home / Self Care | Attending: Gastroenterology

## 2021-12-16 DIAGNOSIS — F419 Anxiety disorder, unspecified: Secondary | ICD-10-CM | POA: Insufficient documentation

## 2021-12-16 DIAGNOSIS — Z79899 Other long term (current) drug therapy: Secondary | ICD-10-CM | POA: Diagnosis not present

## 2021-12-16 DIAGNOSIS — Z0189 Encounter for other specified special examinations: Secondary | ICD-10-CM

## 2021-12-16 DIAGNOSIS — K449 Diaphragmatic hernia without obstruction or gangrene: Secondary | ICD-10-CM | POA: Diagnosis not present

## 2021-12-16 DIAGNOSIS — K219 Gastro-esophageal reflux disease without esophagitis: Secondary | ICD-10-CM | POA: Diagnosis not present

## 2021-12-16 DIAGNOSIS — F17210 Nicotine dependence, cigarettes, uncomplicated: Secondary | ICD-10-CM | POA: Insufficient documentation

## 2021-12-16 HISTORY — DX: Encounter for other specified special examinations: Z01.89

## 2021-12-16 HISTORY — PX: ESOPHAGOGASTRODUODENOSCOPY (EGD) WITH PROPOFOL: SHX5813

## 2021-12-16 SURGERY — ESOPHAGOGASTRODUODENOSCOPY (EGD) WITH PROPOFOL
Anesthesia: General

## 2021-12-16 MED ORDER — LACTATED RINGERS IV SOLN: INTRAVENOUS | Status: DC

## 2021-12-16 MED ORDER — PROPOFOL 10 MG/ML IV BOLUS
INTRAVENOUS | Status: DC | PRN
  Administered 2021-12-16: 50 mg via INTRAVENOUS
  Administered 2021-12-16: 100 mg via INTRAVENOUS
  Administered 2021-12-16: 50 mg via INTRAVENOUS

## 2021-12-16 MED ORDER — STERILE WATER FOR IRRIGATION IR SOLN
Status: DC | PRN
  Administered 2021-12-16: 50 mL

## 2021-12-16 NOTE — Discharge Instructions (Signed)
You are being discharged to home.  Resume your previous diet.  Continue your present medications, can increase omeprazole to twice a day dosing if still symptomatic. If recurrent episodes of heartburn or interested in not taking antacids, can discuss possiblity of Transoral Incisionless Fundoplication (TIF).

## 2021-12-16 NOTE — Anesthesia Preprocedure Evaluation (Signed)
Anesthesia Evaluation  Patient identified by MRN, date of birth, ID band Patient awake    Reviewed: Allergy & Precautions, H&P , NPO status , Patient's Chart, lab work & pertinent test results, reviewed documented beta blocker date and time   Airway Mallampati: II  TM Distance: >3 FB Neck ROM: full    Dental no notable dental hx.    Pulmonary neg pulmonary ROS, Current Smoker,    Pulmonary exam normal breath sounds clear to auscultation       Cardiovascular Exercise Tolerance: Good negative cardio ROS   Rhythm:regular Rate:Normal     Neuro/Psych  Headaches, PSYCHIATRIC DISORDERS Anxiety    GI/Hepatic Neg liver ROS, GERD  Medicated,  Endo/Other  negative endocrine ROS  Renal/GU negative Renal ROS  negative genitourinary   Musculoskeletal   Abdominal   Peds  Hematology negative hematology ROS (+)   Anesthesia Other Findings   Reproductive/Obstetrics negative OB ROS                             Anesthesia Physical Anesthesia Plan  ASA: 2  Anesthesia Plan: General   Post-op Pain Management:    Induction:   PONV Risk Score and Plan: Propofol infusion  Airway Management Planned:   Additional Equipment:   Intra-op Plan:   Post-operative Plan:   Informed Consent: I have reviewed the patients History and Physical, chart, labs and discussed the procedure including the risks, benefits and alternatives for the proposed anesthesia with the patient or authorized representative who has indicated his/her understanding and acceptance.     Dental Advisory Given  Plan Discussed with: CRNA  Anesthesia Plan Comments:         Anesthesia Quick Evaluation

## 2021-12-16 NOTE — Transfer of Care (Signed)
Immediate Anesthesia Transfer of Care Note  Patient: Vincent Walter  Procedure(s) Performed: ESOPHAGOGASTRODUODENOSCOPY (EGD) WITH PROPOFOL  Patient Location: PACU  Anesthesia Type:General  Level of Consciousness: awake, drowsy and patient cooperative  Airway & Oxygen Therapy: Patient Spontanous Breathing and Patient connected to nasal cannula oxygen  Post-op Assessment: Report given to RN, Post -op Vital signs reviewed and stable and Patient moving all extremities X 4  Post vital signs: Reviewed and stable  Last Vitals:  Vitals Value Taken Time  BP See PACU RN flowsheet   Temp    Pulse    Resp    SpO2      Last Pain:  Vitals:   12/16/21 1159  TempSrc:   PainSc: 0-No pain      Patients Stated Pain Goal: 6 (12/16/21 1056)  Complications: No notable events documented.

## 2021-12-16 NOTE — Op Note (Addendum)
[facility] Patient Name: Vincent Walter Procedure Date: 12/16/2021 11:44 AM MRN: [id number] Date of Birth: 1975-09-15 Attending MD: Katrinka Blazing ,  CSN: [id number] Age: 46 Admit Type: Outpatient Procedure:                Upper GI endoscopy Indications:              Gastro-esophageal reflux disease Providers:                Katrinka Blazing, Buel Ream. Thomasena Edis RN, RN, Dyann Ruddle, Hinton Rao Referring MD:              Medicines:                Monitored Anesthesia Care Complications:            No immediate complications. Estimated Blood Loss:     Estimated blood loss: none. Procedure:                Pre-Anesthesia Assessment:                           - Prior to the procedure, a History and Physical                            was performed, and patient medications, allergies                            and sensitivities were reviewed. The patient's                            tolerance of previous anesthesia was reviewed.                           - The risks and benefits of the procedure and the                            sedation options and risks were discussed with the                            patient. All questions were answered and informed                            consent was obtained.                           After obtaining informed consent, the endoscope was                            passed under direct vision. Throughout the                            procedure, the patient's blood pressure, pulse, and                            oxygen saturations were monitored continuously.  The                            GIF-H190 ([id number]) scope was introduced through the                            mouth, and advanced to the second part of duodenum.                            The upper GI endoscopy was accomplished without                            difficulty. The patient tolerated the procedure                            well. Scope In:  11:59:34 AM Scope Out: 12:06:59 PM Total Procedure Duration: 0 hours 7 minutes 25 seconds  Findings:      A 1 cm sliding hiatal hernia was found.      The gastroesophageal flap valve was visualized endoscopically and       classified as Hill Grade II (fold present, opens with respiration).      The stomach was normal.      The examined duodenum was normal. Impression:               - 1 cm sliding hiatal hernia.                           - Gastroesophageal flap valve classified as Hill                            Grade II (fold present, opens with respiration).                           - Normal stomach.                           - Normal examined duodenum.                           - No specimens collected. Moderate Sedation:      Per Anesthesia Care Recommendation:           - Discharge patient to home (ambulatory).                           - Resume previous diet.                           - Continue present medications, can increase                            omeprazole to twice a day dosing if still                            symptomatic.                           -  If recurrent episodes of heartburn or interested                            in not taking antacids, can discuss possiblity of                            Transoral Incisionless Fundoplication (TIF). Procedure Code(s):        --- Professional ---                           [phone], Esophagogastroduodenoscopy, flexible,                            transoral; diagnostic, including collection of                            specimen(s) by brushing or washing, when performed                            (separate procedure) Diagnosis Code(s):        --- Professional ---                           K44.9, Diaphragmatic hernia without obstruction or                            gangrene                           K21.9, Gastro-esophageal reflux disease without                            esophagitis CPT copyright 2019 American Medical  Association. All rights reserved. The codes documented in this report are preliminary and upon coder review may  be revised to meet current compliance requirements. Katrinka Blazing, MD Katrinka Blazing,  12/16/2021 12:13:33 PM This report has been signed electronically. Number of Addenda: 0

## 2021-12-16 NOTE — Interval H&P Note (Signed)
History and Physical Interval Note:  12/16/2021 11:00 AM  Vincent Walter  has presented today for surgery, with the diagnosis of GERD.  The various methods of treatment have been discussed with the patient and family. After consideration of risks, benefits and other options for treatment, the patient has consented to  Procedure(s) with comments: ESOPHAGOGASTRODUODENOSCOPY (EGD) WITH PROPOFOL (N/A) - 1230  ASA 1 as a surgical intervention.  The patient's history has been reviewed, patient examined, no change in status, stable for surgery.  I have reviewed the patient's chart and labs.  Questions were answered to the patient's satisfaction.     Katrinka Blazing [place]a

## 2021-12-18 NOTE — Anesthesia Postprocedure Evaluation (Signed)
Anesthesia Post Note  Patient: Vincent Walter  Procedure(s) Performed: ESOPHAGOGASTRODUODENOSCOPY (EGD) WITH PROPOFOL  Patient location during evaluation: Phase II Anesthesia Type: General Level of consciousness: awake Pain management: pain level controlled Vital Signs Assessment: post-procedure vital signs reviewed and stable Respiratory status: spontaneous breathing and respiratory function stable Cardiovascular status: blood pressure returned to baseline and stable Postop Assessment: no headache and no apparent nausea or vomiting Anesthetic complications: no Comments: Late entry   No notable events documented.   Last Vitals:  Vitals:   12/16/21 1210 12/16/21 1215  BP: 105/63 124/76  Pulse: 70 72  Resp: 16 18  Temp: 36.6 C   SpO2: 96% 97%    Last Pain:  Vitals:   12/16/21 1215  TempSrc:   PainSc: 0-No pain                 Windell Norfolk

## 2021-12-20 ENCOUNTER — Encounter: Payer: Self-pay | Admitting: Internal Medicine

## 2021-12-21 ENCOUNTER — Encounter (HOSPITAL_COMMUNITY): Payer: Self-pay | Admitting: Gastroenterology

## 2022-01-07 ENCOUNTER — Other Ambulatory Visit: Payer: Self-pay | Admitting: Medical

## 2022-01-25 ENCOUNTER — Encounter: Payer: Self-pay | Admitting: Internal Medicine

## 2022-02-14 ENCOUNTER — Ambulatory Visit (INDEPENDENT_AMBULATORY_CARE_PROVIDER_SITE_OTHER): Payer: BC Managed Care – PPO | Admitting: Medical

## 2022-02-14 ENCOUNTER — Encounter: Payer: Self-pay | Admitting: Medical

## 2022-02-14 VITALS — BP 120/82 | HR 70 | Temp 98.5°F | Ht 69.0 in | Wt 149.4 lb

## 2022-02-14 DIAGNOSIS — F411 Generalized anxiety disorder: Secondary | ICD-10-CM

## 2022-02-14 DIAGNOSIS — Z23 Encounter for immunization: Secondary | ICD-10-CM

## 2022-02-14 DIAGNOSIS — Z7185 Encounter for immunization safety counseling: Secondary | ICD-10-CM

## 2022-02-14 DIAGNOSIS — Z1211 Encounter for screening for malignant neoplasm of colon: Secondary | ICD-10-CM

## 2022-02-14 DIAGNOSIS — Z87891 Personal history of nicotine dependence: Secondary | ICD-10-CM

## 2022-02-14 DIAGNOSIS — Z13220 Encounter for screening for lipoid disorders: Secondary | ICD-10-CM

## 2022-02-14 DIAGNOSIS — Z0000 Encounter for general adult medical examination without abnormal findings: Secondary | ICD-10-CM

## 2022-02-14 DIAGNOSIS — R42 Dizziness and giddiness: Secondary | ICD-10-CM

## 2022-02-14 MED ORDER — ALPRAZOLAM 0.25 MG PO TABS: 0.2500 mg | ORAL_TABLET | Freq: Every day | ORAL | 3 refills | Status: DC

## 2022-02-14 MED ORDER — MECLIZINE HCL 25 MG PO TABS: 25.0000 mg | ORAL_TABLET | Freq: Two times a day (BID) | ORAL | 0 refills | Status: DC

## 2022-02-14 NOTE — Patient Instructions (Signed)
This visit was a preventative care visit, also known as wellness visit or routine physical.   Topics typically include healthy lifestyle, diet, exercise, preventative care, vaccinations, sick and well care, proper use of emergency dept and after hours care, as well as other concerns.     Recommendations: Continue to return yearly for your annual wellness and preventative care visits.  This gives [place] a chance to discuss healthy lifestyle, exercise, vaccinations, review your chart record, and perform screenings where appropriate.  I recommend you see your eye doctor yearly for routine vision care.  I recommend you see your dentist yearly for routine dental care including hygiene visits twice yearly.   Vaccination recommendations were reviewed Immunization History  Administered Date(s) Administered   Influenza Split 02/23/2015   Influenza,inj,Quad PF,6+ Mos 05/15/2019, 02/14/2022   Influenza-Unspecified 02/24/2016, 03/16/2020    Counseled on the influenza virus vaccine.  Vaccine information sheet given.  Influenza vaccine given after consent obtained.  Counseled on the Tdap (tetanus, diptheria, and acellular pertussis) vaccine.  Vaccine information sheet given. Tdap vaccine given after consent obtained.   Screening for cancer: Colon cancer screening: We will refer you for Cologard   Testicular cancer screening You should do a monthly self testicular exam if you are between 46-47 years old  We discussed PSA, prostate exam, and prostate cancer screening risks/benefits.     Skin cancer screening: Check your skin regularly for new changes, growing lesions, or other lesions of concern Come in for evaluation if you have skin lesions of concern.  Lung cancer screening: If you have a greater than 20 pack year history of tobacco use, then you may qualify for lung cancer screening with a chest CT scan.   Please call your insurance company to inquire about coverage for this test.  We  currently don't have screenings for other cancers besides breast, cervical, colon, and lung cancers.  If you have a strong family history of cancer or have other cancer screening concerns, please let me know.    Bone health: Get at least 150 minutes of aerobic exercise weekly Get weight bearing exercise at least once weekly Bone density test:  A bone density test is an imaging test that uses a type of X-ray to measure the amount of calcium and other minerals in your bones. The test may be used to diagnose or screen you for a condition that causes weak or thin bones (osteoporosis), predict your risk for a broken bone (fracture), or determine how well your osteoporosis treatment is working. The bone density test is recommended for females 39 and older, or females or males [id number] if certain risk factors such as thyroid disease, long term use of steroids such as for asthma or rheumatological issues, vitamin D deficiency, estrogen deficiency, family history of osteoporosis, self or family history of fragility fracture in first degree relative.    Heart health: Get at least 150 minutes of aerobic exercise weekly Limit alcohol It is important to maintain a healthy blood pressure and healthy cholesterol numbers  Heart disease screening: Screening for heart disease includes screening for blood pressure, fasting lipids, glucose/diabetes screening, BMI height to weight ratio, reviewed of smoking status, physical activity, and diet.    Goals include blood pressure 120/80 or less, maintaining a healthy lipid/cholesterol profile, preventing diabetes or keeping diabetes numbers under good control, not smoking or using tobacco products, exercising most days per week or at least 150 minutes per week of exercise, and eating healthy variety of fruits and vegetables, healthy  oils, and avoiding unhealthy food choices like fried food, fast food, high sugar and high cholesterol foods.    Other tests may possibly  include EKG test, CT coronary calcium score, echocardiogram, exercise treadmill stress test.   2017 EKG reviewed   Medical care options: I recommend you continue to seek care here first for routine care.  We try really hard to have available appointments Monday through Friday daytime hours for sick visits, acute visits, and physicals.  Urgent care should be used for after hours and weekends for significant issues that cannot wait till the next day.  The emergency department should be used for significant potentially life-threatening emergencies.  The emergency department is expensive, can often have long wait times for less significant concerns, so try to utilize primary care, urgent care, or telemedicine when possible to avoid unnecessary trips to the emergency department.  Virtual visits and telemedicine have been introduced since the pandemic started in 2020, and can be convenient ways to receive medical care.  We offer virtual appointments as well to assist you in a variety of options to seek medical care.   Advanced Directives: I recommend you consider completing a [place] and Living Will.   These documents respect your wishes and help alleviate burdens on your loved ones if you were to become terminally ill or be in a position to need those documents enforced.    You can complete Advanced Directives yourself, have them notarized, then have copies made for our office, for you and for anybody you feel should have them in safe keeping.  Or, you can have an attorney prepare these documents.   If you haven't updated your Last Will and Testament in a while, it may be worthwhile having an attorney prepare these documents together and save on some costs.       Separate significant issues discussed: Recommend you quit smoking completely  Vertigo-you can try the higher dose meclizine twice daily for the next few days.  If not improving assuming labs are normal I would recommend  referral to physical therapy for vestibular rehab to help with the vertigo  Anxiety-continue Xanax as needed, reduce stress where possible

## 2022-02-14 NOTE — Progress Notes (Signed)
Subjective:   HPI  Vincent Walter is a 46 y.o. male who presents for Chief Complaint  Patient presents with   Annual Exam    CPE vertigo 2 and half weeks,    Patient Care Team: Fadil Macmaster, Cleda Mccreedy as PCP - General (Family Medicine) Sees dentist Sees eye doctor  Concerns: He notes problems with vertigo.  He has had this in the past.  Normally resolves within a few days with current symptoms of been going on intermittent for the past 2.5 weeks.  Worse if he is turning to the left, worse with getting up fast from a sitting position.  He does have some ringing in the ears occasionally but no hearing loss.  He has hearing test at work regular yearly and they note some age-related changes but nothing significant.  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  His water intake is good.  No numbness tingling weakness confusion or slurred speech.  He notes that he quit smoking about a month ago  Reviewed their medical, surgical, family, social, medication, and allergy history and updated chart as appropriate.  Past Medical History:  Diagnosis Date   Anxiety    Chronic back pain    GERD (gastroesophageal reflux disease)    Headache syndrome    related to anxiety   Normal esophagogastroduodenoscopy (EGD) 12/16/2021   hiatal hernia found, normal stomach normal duodenum   Tobacco abuse    Wears glasses     Past Surgical History:  Procedure Laterality Date   ESOPHAGOGASTRODUODENOSCOPY (EGD) WITH PROPOFOL N/A 12/16/2021   Procedure: ESOPHAGOGASTRODUODENOSCOPY (EGD) WITH PROPOFOL;  Surgeon: Dolores Frame, MD;  [facility]: [facility];  Service: Gastroenterology;  Laterality: N/A;  1230  ASA 1   MANDIBLE FRACTURE SURGERY     SHOULDER SURGERY Right     Family History  Problem Relation Age of Onset   Alcohol abuse Mother    Hypertension Father    Cancer Father        skin   Hyperlipidemia Brother    Hypertension Brother    Anxiety disorder Daughter    Cancer  Paternal Grandfather    Heart disease Neg Hx    Other Neg Hx    Stroke Neg Hx    Diabetes Neg Hx      Current Outpatient Medications:    meclizine (ANTIVERT) 25 MG tablet, Take 1 tablet (25 mg total) by mouth 2 (two) times daily., Disp: 30 tablet, Rfl: 0   omeprazole (PRILOSEC) 40 MG capsule, Take 1 capsule (40 mg total) by mouth daily., Disp: 90 capsule, Rfl: 1   ALPRAZolam (XANAX) 0.25 MG tablet, Take 1 tablet (0.25 mg total) by mouth daily., Disp: 30 tablet, Rfl: 3  No Known Allergies   Review of Systems Constitutional: -fever, -chills, -sweats, -unexpected weight change, -decreased appetite, -fatigue Allergy: -sneezing, -itching, -congestion Dermatology: -changing moles, --rash, -lumps ENT: -runny nose, -ear pain, -sore throat, -hoarseness, -sinus pain, -teeth pain, - ringing in ears, -hearing loss, -nosebleeds Cardiology: -chest pain, -palpitations, -swelling, -difficulty breathing when lying flat, -waking up short of breath Respiratory: -cough, -shortness of breath, -difficulty breathing with exercise or exertion, -wheezing, -coughing up blood Gastroenterology: -abdominal pain, -nausea, -vomiting, -diarrhea, -constipation, -blood in stool, -changes in bowel movement, -difficulty swallowing or eating Hematology: -bleeding, -bruising  Musculoskeletal: -joint aches, -muscle aches, -joint swelling, -back pain, -neck pain, -cramping, -changes in gait Ophthalmology: denies vision changes, eye redness, itching, discharge Urology: -burning with urination, -difficulty urinating, -blood in urine, -urinary frequency, -urgency, -  incontinence Neurology: -headache, -weakness, -tingling, -numbness, -memory loss, -falls, -dizziness Psychology: -depressed mood, -agitation, -sleep problems Male GU: no testicular mass, pain, no lymph nodes swollen, no swelling, no rash.     02/14/2022    3:18 PM 10/12/2021    2:05 PM 01/14/2021    8:29 AM 10/29/2020    9:17 AM 05/15/2019   10:02 AM  Depression  screen PHQ 2/9  Decreased Interest 0 0 0 0 0  Down, Depressed, Hopeless 0 0 0 0 0  PHQ - 2 Score 0 0 0 0 0        Objective:  BP 120/82   Pulse 70   Temp 98.5 F (36.9 C)   Ht 5\' 9"  (1.753 m)   Wt 149 lb 6.4 oz (67.8 kg)   BMI 22.06 kg/m   General appearance: alert, no distress, WD/WN, Caucasian male Skin: tattoo of panther right upper arm, irregular dark brown 2cm x 1cm lesion left upper anterior arm unchanged per patient for years, scattered macules, no other worrisome lesions HEENT: normocephalic, conjunctiva/corneas normal, sclerae anicteric, PERRLA, EOMi, nares patent, no discharge or erythema, pharynx normal Oral cavity: MMM, tongue normal, teeth normal Neck: supple, no lymphadenopathy, no thyromegaly, no masses, normal ROM, no bruits Chest: non tender, normal shape and expansion Heart: RRR, normal S1, S2, no murmurs Lungs: CTA bilaterally, no wheezes, rhonchi, or rales Abdomen: +bs, soft, non tender, non distended, no masses, no hepatomegaly, no splenomegaly, no bruits Back: non tender, normal ROM, no scoliosis Musculoskeletal: upper extremities non tender, no obvious deformity, normal ROM throughout, lower extremities non tender, no obvious deformity, normal ROM throughout Extremities: no edema, no cyanosis, no clubbing Pulses: 2+ symmetric, upper and lower extremities, normal cap refill Neurological: alert, oriented x 3, CN2-12 intact, strength normal upper extremities and lower extremities, sensation normal throughout, DTRs 2+ throughout, no cerebellar signs, gait normal Psychiatric: normal affect, behavior normal, pleasant  GU: normal male external genitalia,circumcised, nontender, no masses, no hernia, no lymphadenopathy Rectal: deferred   Assessment and Plan :   Encounter Diagnoses  Name Primary?   Routine general medical examination at a health care facility Yes   Need for influenza vaccination    Generalized anxiety disorder    Vaccine counseling     Screening for lipid disorders    Screen for colon cancer    Vertigo    Need for Tdap vaccination    Former smoker     This visit was a preventative care visit, also known as wellness visit or routine physical.   Topics typically include healthy lifestyle, diet, exercise, preventative care, vaccinations, sick and well care, proper use of emergency dept and after hours care, as well as other concerns.     Recommendations: Continue to return yearly for your annual wellness and preventative care visits.  This gives a chance to discuss healthy lifestyle, exercise, vaccinations, review your chart record, and perform screenings where appropriate.  I recommend you see your eye doctor yearly for routine vision care.  I recommend you see your dentist yearly for routine dental care including hygiene visits twice yearly.   Vaccination recommendations were reviewed Immunization History  Administered Date(s) Administered   Influenza Split 02/23/2015   Influenza,inj,Quad PF,6+ Mos 05/15/2019, 02/14/2022   Influenza-Unspecified 02/24/2016, 03/16/2020   Tdap 02/14/2022    Counseled on the influenza virus vaccine.  Vaccine information sheet given.  Influenza vaccine given after consent obtained.  Counseled on the Tdap (tetanus, diptheria, and acellular pertussis) vaccine.  Vaccine information sheet  given. Tdap vaccine given after consent obtained.   Screening for cancer: Colon cancer screening: We will refer you for Cologard   Testicular cancer screening You should do a monthly self testicular exam if you are between 57-12 years old  We discussed PSA, prostate exam, and prostate cancer screening risks/benefits.     Skin cancer screening: Check your skin regularly for new changes, growing lesions, or other lesions of concern Come in for evaluation if you have skin lesions of concern.  Lung cancer screening: If you have a greater than 20 pack year history of tobacco use, then you may  qualify for lung cancer screening with a chest CT scan.   Please call your insurance company to inquire about coverage for this test.  We currently don't have screenings for other cancers besides breast, cervical, colon, and lung cancers.  If you have a strong family history of cancer or have other cancer screening concerns, please let me know.    Bone health: Get at least 150 minutes of aerobic exercise weekly Get weight bearing exercise at least once weekly Bone density test:  A bone density test is an imaging test that uses a type of X-ray to measure the amount of calcium and other minerals in your bones. The test may be used to diagnose or screen you for a condition that causes weak or thin bones (osteoporosis), predict your risk for a broken bone (fracture), or determine how well your osteoporosis treatment is working. The bone density test is recommended for females 65 and older, or females or males <65 if certain risk factors such as thyroid disease, long term use of steroids such as for asthma or rheumatological issues, vitamin D deficiency, estrogen deficiency, family history of osteoporosis, self or family history of fragility fracture in first degree relative.    Heart health: Get at least 150 minutes of aerobic exercise weekly Limit alcohol It is important to maintain a healthy blood pressure and healthy cholesterol numbers  Heart disease screening: Screening for heart disease includes screening for blood pressure, fasting lipids, glucose/diabetes screening, BMI height to weight ratio, reviewed of smoking status, physical activity, and diet.    Goals include blood pressure 120/80 or less, maintaining a healthy lipid/cholesterol profile, preventing diabetes or keeping diabetes numbers under good control, not smoking or using tobacco products, exercising most days per week or at least 150 minutes per week of exercise, and eating healthy variety of fruits and vegetables, healthy oils,  and avoiding unhealthy food choices like fried food, fast food, high sugar and high cholesterol foods.    Other tests may possibly include EKG test, CT coronary calcium score, echocardiogram, exercise treadmill stress test.   2017 EKG reviewed   Medical care options: I recommend you continue to seek care here first for routine care.  We try really hard to have available appointments Monday through Friday daytime hours for sick visits, acute visits, and physicals.  Urgent care should be used for after hours and weekends for significant issues that cannot wait till the next day.  The emergency department should be used for significant potentially life-threatening emergencies.  The emergency department is expensive, can often have long wait times for less significant concerns, so try to utilize primary care, urgent care, or telemedicine when possible to avoid unnecessary trips to the emergency department.  Virtual visits and telemedicine have been introduced since the pandemic started in 2020, and can be convenient ways to receive medical care.  We offer virtual appointments as  well to assist you in a variety of options to seek medical care.   Advanced Directives: I recommend you consider completing a [place] and Living Will.   These documents respect your wishes and help alleviate burdens on your loved ones if you were to become terminally ill or be in a position to need those documents enforced.    You can complete Advanced Directives yourself, have them notarized, then have copies made for our office, for you and for anybody you feel should have them in safe keeping.  Or, you can have an attorney prepare these documents.   If you haven't updated your Last Will and Testament in a while, it may be worthwhile having an attorney prepare these documents together and save on some costs.       Separate significant issues discussed: He reports quitting tobacco 1 months  ago  Vertigo-you can try the higher dose meclizine twice daily for the next few days.  If not improving assuming labs are normal I would recommend referral to physical therapy for vestibular rehab to help with the vertigo  Anxiety-continue Xanax as needed, reduce stress where possible   Jaramie was seen today for annual exam.  Diagnoses and all orders for this visit:  Routine general medical examination at a health care facility -     Comprehensive metabolic panel -     CBC -     Lipid panel -     Cancel: POCT Urinalysis DIP (Proadvantage Device) -     Cologuard  Need for influenza vaccination -     Flu Vaccine QUAD 6+ mos PF IM (Fluarix Quad PF)  Generalized anxiety disorder  Vaccine counseling  Screening for lipid disorders -     Lipid panel  Screen for colon cancer -     Cologuard  Vertigo  Need for Tdap vaccination -     Tdap vaccine greater than or equal to 7yo IM  Former smoker  Other orders -     meclizine (ANTIVERT) 25 MG tablet; Take 1 tablet (25 mg total) by mouth 2 (two) times daily. -     ALPRAZolam (XANAX) 0.25 MG tablet; Take 1 tablet (0.25 mg total) by mouth daily.    Follow-up pending labs, yearly for physical

## 2022-02-15 LAB — CBC
Hematocrit: 49 % (ref 37.5–51.0)
Hemoglobin: 16.9 g/dL (ref 13.0–17.7)
MCH: 33.1 pg — ABNORMAL HIGH (ref 26.6–33.0)
MCHC: 34.5 g/dL (ref 31.5–35.7)
MCV: 96 fL (ref 79–97)
Platelets: 245 10*3/uL (ref 150–450)
RBC: 5.11 x10E6/uL (ref 4.14–5.80)
RDW: 12.8 % (ref 11.6–15.4)
WBC: 6.4 10*3/uL (ref 3.4–10.8)

## 2022-02-15 LAB — COMPREHENSIVE METABOLIC PANEL
ALT: 23 IU/L (ref 0–44)
AST: 18 IU/L (ref 0–40)
Albumin/Globulin Ratio: 2 (ref 1.2–2.2)
Albumin: 4.6 g/dL (ref 4.1–5.1)
Alkaline Phosphatase: 106 IU/L (ref 44–121)
BUN/Creatinine Ratio: 7 — ABNORMAL LOW (ref 9–20)
BUN: 6 mg/dL (ref 6–24)
Bilirubin Total: 0.6 mg/dL (ref 0.0–1.2)
CO2: 20 mmol/L (ref 20–29)
Calcium: 10 mg/dL (ref 8.7–10.2)
Chloride: 105 mmol/L (ref 96–106)
Creatinine, Ser: 0.87 mg/dL (ref 0.76–1.27)
Globulin, Total: 2.3 g/dL (ref 1.5–4.5)
Glucose: 88 mg/dL (ref 70–99)
Potassium: 4.5 mmol/L (ref 3.5–5.2)
Sodium: 142 mmol/L (ref 134–144)
Total Protein: 6.9 g/dL (ref 6.0–8.5)
eGFR: 107 mL/min/{1.73_m2} (ref >59)

## 2022-02-15 LAB — LIPID PANEL
Chol/HDL Ratio: 3.4 ratio (ref 0.0–5.0)
Cholesterol, Total: 189 mg/dL (ref 100–199)
HDL: 56 mg/dL (ref >39)
LDL Chol Calc (NIH): 117 mg/dL — ABNORMAL HIGH (ref 0–99)
Triglycerides: 89 mg/dL (ref 0–149)
VLDL Cholesterol Cal: 16 mg/dL (ref 5–40)

## 2022-03-15 DIAGNOSIS — Z1211 Encounter for screening for malignant neoplasm of colon: Secondary | ICD-10-CM | POA: Diagnosis not present

## 2022-03-22 LAB — COLOGUARD: COLOGUARD: NEGATIVE

## 2022-04-06 ENCOUNTER — Other Ambulatory Visit: Payer: Self-pay | Admitting: Medical

## 2022-04-06 ENCOUNTER — Telehealth: Payer: Self-pay | Admitting: Medical

## 2022-04-06 MED ORDER — SCOPOLAMINE 1 MG/3DAYS TD PT72: 1.0000 | MEDICATED_PATCH | TRANSDERMAL | 0 refills | Status: DC

## 2022-04-06 NOTE — Telephone Encounter (Signed)
Pt going on a cruise and is requesting the nausea patches for if he gets sea sick. Preferred pharmacy  [id number] - [place], [place] - [address] AT [facility]

## 2022-05-10 ENCOUNTER — Ambulatory Visit
Admission: EM | Admit: 2022-05-10 | Discharge: 2022-05-10 | Disposition: A | Discharge status: Home / Self Care | Payer: BC Managed Care – PPO | Attending: Family Medicine | Admitting: Family Medicine

## 2022-05-10 DIAGNOSIS — J101 Influenza due to other identified influenza virus with other respiratory manifestations: Secondary | ICD-10-CM | POA: Diagnosis not present

## 2022-05-10 DIAGNOSIS — Z1152 Encounter for screening for COVID-19: Secondary | ICD-10-CM | POA: Diagnosis not present

## 2022-05-10 DIAGNOSIS — J069 Acute upper respiratory infection, unspecified: Secondary | ICD-10-CM | POA: Diagnosis not present

## 2022-05-10 MED ORDER — PROMETHAZINE-DM 6.25-15 MG/5ML PO SYRP: 5.0000 mL | ORAL_SOLUTION | Freq: Four times a day (QID) | ORAL | 0 refills | Status: DC | PRN

## 2022-05-10 MED ORDER — FLUTICASONE PROPIONATE 50 MCG/ACT NA SUSP: 1.0000 | Freq: Two times a day (BID) | NASAL | 2 refills | Status: DC

## 2022-05-10 NOTE — ED Provider Notes (Signed)
[facility]    CSN: [id number] Arrival date & time: 05/10/22  0850      History   Chief Complaint Chief Complaint  Patient presents with   Cough   Nasal Congestion   Fever   Sore Throat   Generalized Body Aches   Chills    HPI Vincent Walter is a 46 y.o. male.   Patient presenting today with 2-day history of cough, congestion, sore throat, chills, body aches, low-grade fevers.  Denies chest pain, shortness of breath, abdominal pain, nausea vomiting or diarrhea.  Taking Mucinex and NyQuil with mild temporary relief of symptoms.  Recent exposure to multiple sick contacts.    Past Medical History:  Diagnosis Date   Anxiety    Chronic back pain    GERD (gastroesophageal reflux disease)    Headache syndrome    related to anxiety   Normal esophagogastroduodenoscopy (EGD) 12/16/2021   hiatal hernia found, normal stomach normal duodenum   Tobacco abuse    Wears glasses     Patient Active Problem List   Diagnosis Date Noted   Vertigo 02/14/2022   Gastroesophageal reflux disease 10/12/2021   Indigestion 10/12/2021   Screening for lung cancer 01/14/2021   Screening for lipid disorders 01/14/2021   Plantar wart 01/14/2021   Encounter for health maintenance examination in adult 01/14/2021   Screen for colon cancer 01/14/2021   Abnormal PFT 01/14/2021   Arthralgia of left hand 12/20/2020   Work stress 09/22/2020   Chronic shoulder pain 01/16/2020   S/P shoulder surgery 01/16/2020   Need for influenza vaccination 05/15/2019   Skin lesion 03/22/2018   Situational phobia 11/29/2016   Routine general medical examination at a health care facility 03/29/2016   Smoker 03/29/2016   Vaccine counseling 03/29/2016   Generalized anxiety disorder 03/29/2016    Past Surgical History:  Procedure Laterality Date   ESOPHAGOGASTRODUODENOSCOPY (EGD) WITH PROPOFOL N/A 12/16/2021   Procedure: ESOPHAGOGASTRODUODENOSCOPY (EGD) WITH PROPOFOL;  Surgeon: Dolores Frame, MD;  Location: [facility];  Service: Gastroenterology;  Laterality: N/A;  1230  ASA 1   MANDIBLE FRACTURE SURGERY     SHOULDER SURGERY Right        Home Medications    Prior to Admission medications   Medication Sig Start Date End Date Taking? Authorizing Provider  ALPRAZolam (XANAX) 0.25 MG tablet Take 1 tablet (0.25 mg total) by mouth daily. 02/14/22  Yes Tysinger, Kermit Balo, PA-C  fluticasone (FLONASE) 50 MCG/ACT nasal spray Place 1 spray into both nostrils 2 (two) times daily. 05/10/22  Yes Particia Nearing, PA-C  meclizine (ANTIVERT) 25 MG tablet Take 1 tablet (25 mg total) by mouth 2 (two) times daily. 02/14/22  Yes Tysinger, Kermit Balo, PA-C  omeprazole (PRILOSEC) 40 MG capsule Take 1 capsule (40 mg total) by mouth daily. 01/09/22  Yes Tysinger, Kermit Balo, PA-C  promethazine-dextromethorphan (PROMETHAZINE-DM) 6.25-15 MG/5ML syrup Take 5 mLs by mouth 4 (four) times daily as needed. 05/10/22  Yes Particia Nearing, PA-C  scopolamine (TRANSDERM-SCOP) 1 MG/3DAYS Place 1 patch (1.5 mg total) onto the skin every 3 (three) days. 04/06/22  Yes Tysinger, Kermit Balo, PA-C    Family History Family History  Problem Relation Age of Onset   Alcohol abuse Mother    Hypertension Father    Cancer Father        skin   Hyperlipidemia Brother    Hypertension Brother    Anxiety disorder Daughter    Cancer Paternal Grandfather    Heart disease  Neg Hx    Other Neg Hx    Stroke Neg Hx    Diabetes Neg Hx     Social History Social History   Tobacco Use   Smoking status: Every Day    Packs/day: 1.00    Years: 27.00    Total pack years: 27.00    Types: Cigarettes   Smokeless tobacco: Current  Vaping Use   Vaping Use: Never used  Substance Use Topics   Alcohol use: Yes    Alcohol/week: 7.0 standard drinks of alcohol    Types: 7 Cans of beer per week    Comment: occasional   Drug use: No     Allergies   Patient has no known allergies.   Review of Systems Review of  Systems PER HPI  Physical Exam Triage Vital Signs ED Triage Vitals [05/10/22 1058]  Enc Vitals Group     BP 124/87     Pulse Rate 77     Resp 16     Temp 99.3 F (37.4 C)     Temp Source Oral     SpO2 96 %     Weight      Height      Head Circumference      Peak Flow      Pain Score 0     Pain Loc      Pain Edu?      Excl. in GC?    No data found.  Updated Vital Signs BP 124/87 (BP Location: Right Arm)   Pulse 77   Temp 99.3 F (37.4 C) (Oral)   Resp 16   SpO2 96%   Visual Acuity Right Eye Distance:   Left Eye Distance:   Bilateral Distance:    Right Eye Near:   Left Eye Near:    Bilateral Near:     Physical Exam Vitals and nursing note reviewed.  Constitutional:      Appearance: He is well-developed.  HENT:     Head: Atraumatic.     Right Ear: External ear normal.     Left Ear: External ear normal.     Nose: Rhinorrhea present.     Mouth/Throat:     Pharynx: Posterior oropharyngeal erythema present. No oropharyngeal exudate.  Eyes:     Conjunctiva/sclera: Conjunctivae normal.     Pupils: Pupils are equal, round, and reactive to light.  Cardiovascular:     Rate and Rhythm: Normal rate and regular rhythm.  Pulmonary:     Effort: Pulmonary effort is normal. No respiratory distress.     Breath sounds: No wheezing or rales.  Musculoskeletal:        General: Normal range of motion.     Cervical back: Normal range of motion and neck supple.  Lymphadenopathy:     Cervical: No cervical adenopathy.  Skin:    General: Skin is warm and dry.  Neurological:     Mental Status: He is alert and oriented to person, place, and time.  Psychiatric:        Behavior: Behavior normal.      UC Treatments / Results  Labs (all labs ordered are listed, but only abnormal results are displayed) Labs Reviewed  RESP PANEL BY RT-PCR (FLU A&B, COVID) [id number]    EKG   Radiology No results found.  Procedures Procedures (including critical care  time)  Medications Ordered in UC Medications - No data to display  Initial Impression / Assessment and Plan / UC Course  I have reviewed  the triage vital signs and the nursing notes.  Pertinent labs & imaging results that were available during my care of the patient were reviewed by me and considered in my medical decision making (see chart for details).     Vitals and exam reassuring and suspicious for viral respiratory infection, likely influenza.  Treat with Phenergan DM, Flonase, support over-the-counter medications and home care while awaiting results.  Return for worsening symptoms.  Final Clinical Impressions(s) / UC Diagnoses   Final diagnoses:  Viral URI with cough   Discharge Instructions   None    ED Prescriptions     Medication Sig Dispense Auth. Provider   promethazine-dextromethorphan (PROMETHAZINE-DM) 6.25-15 MG/5ML syrup Take 5 mLs by mouth 4 (four) times daily as needed. 100 mL Particia Nearing, PA-C   fluticasone [facility]) 50 MCG/ACT nasal spray Place 1 spray into both nostrils 2 (two) times daily. 16 g Particia Nearing, [place]      PDMP not reviewed this encounter.   Particia Nearing, [place] 05/10/22 1239

## 2022-05-10 NOTE — ED Triage Notes (Signed)
Congestion, cough, sore throat, chills, body aches that started monday. Taking mucinex and nyquil.

## 2022-05-11 LAB — RESP PANEL BY RT-PCR (FLU A&B, COVID) ARPGX2
Influenza A by PCR: POSITIVE — AB
Influenza B by PCR: NEGATIVE
SARS Coronavirus 2 by RT PCR: NEGATIVE

## 2022-06-08 ENCOUNTER — Ambulatory Visit (INDEPENDENT_AMBULATORY_CARE_PROVIDER_SITE_OTHER): Payer: BC Managed Care – PPO | Admitting: Gastroenterology

## 2022-06-19 ENCOUNTER — Other Ambulatory Visit: Payer: Self-pay | Admitting: Medical

## 2022-07-13 ENCOUNTER — Other Ambulatory Visit: Payer: Self-pay | Admitting: Medical

## 2022-10-17 ENCOUNTER — Other Ambulatory Visit: Payer: Self-pay | Admitting: Medical

## 2022-11-20 ENCOUNTER — Other Ambulatory Visit: Payer: Self-pay | Admitting: Medical

## 2023-02-19 ENCOUNTER — Telehealth: Payer: Self-pay

## 2023-02-19 ENCOUNTER — Other Ambulatory Visit: Payer: Self-pay | Admitting: Medical

## 2023-02-19 MED ORDER — ALPRAZOLAM 0.25 MG PO TABS: 0.2500 mg | ORAL_TABLET | Freq: Every day | ORAL | 0 refills | Status: DC

## 2023-02-19 NOTE — Telephone Encounter (Signed)
Pt scheduled a virtual med check because of his job. Would like refill on xanax.

## 2023-02-20 NOTE — Telephone Encounter (Signed)
Called pt, no answer, left voicemail that he needs to schedule physical for more refills.

## 2023-02-27 ENCOUNTER — Telehealth (INDEPENDENT_AMBULATORY_CARE_PROVIDER_SITE_OTHER): Payer: Self-pay | Admitting: Medical

## 2023-02-27 ENCOUNTER — Encounter: Payer: Self-pay | Admitting: Medical

## 2023-02-27 VITALS — Wt 145.0 lb

## 2023-02-27 DIAGNOSIS — F40248 Other situational type phobia: Secondary | ICD-10-CM

## 2023-02-27 DIAGNOSIS — F411 Generalized anxiety disorder: Secondary | ICD-10-CM

## 2023-02-27 MED ORDER — ALPRAZOLAM 0.25 MG PO TABS: 0.2500 mg | ORAL_TABLET | Freq: Every day | ORAL | 3 refills | Status: DC

## 2023-02-27 NOTE — Progress Notes (Signed)
Subjective:     Patient ID: Vincent Walter, male   DOB: [birth year], 47 y.o.   MRN: [id number]  This visit type was conducted due to national recommendations for restrictions regarding the COVID-19 Pandemic (e.g. social distancing) in an effort to limit this patient's exposure and mitigate transmission in our community.  Due to their co-morbid illnesses, this patient is at least at moderate risk for complications without adequate follow up.  This format is felt to be most appropriate for this patient at this time.    Documentation for virtual audio and video telecommunications through [facility] encounter:  The patient was located at home. The provider was located in the office. The patient did consent to this visit and is aware of possible charges through their insurance for this visit.  The other persons participating in this telemedicine service were none. Time spent on call was 20 minutes and in review of previous records 20 minutes total.  This virtual service is not related to other E/M service within previous 7 days.   HPI Chief Complaint  Patient presents with   Medical Management of Chronic Issues    Medcheck- needs a refill on xanax. Doesn't have insurance unitl November    Doing well, started new job recently.  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  Left other job 6 months ago.  Stress hasn't been as bad.  Needed some life /stress changes, but so far the new job is going well.  Most anxiety is morning but usually by afternoon, does well.   Still lives in [place].    Home life is stable.   No concerns.   Exercise - walking on the job.    He and brother in law play golf regularly and this helps his stress.  Does some video games.  No other aggravating or relieving factors. No other complaint.   Past Medical History:  Diagnosis Date   Anxiety    Chronic back pain    GERD (gastroesophageal reflux disease)    Headache syndrome    related to anxiety    Normal esophagogastroduodenoscopy (EGD) 12/16/2021   hiatal hernia found, normal stomach normal duodenum   Tobacco abuse    Wears glasses    Current Outpatient Medications on File Prior to Visit  Medication Sig Dispense Refill   omeprazole (PRILOSEC) 40 MG capsule TAKE 1 CAPSULE (40 MG TOTAL) BY MOUTH DAILY. 90 capsule 1   No current facility-administered medications on file prior to visit.     Review of Systems As in subjective    Objective:   Physical Exam Due to coronavirus pandemic stay at home measures, patient visit was virtual and they were not examined in person.   Wt 145 lb (65.8 kg)   BMI 21.41 kg/m   Gen: wd, wn, nad Psych: pleasant, good eye contact, answers questions appropriately      Assessment:     Encounter Diagnoses  Name Primary?   Generalized anxiety disorder Yes   Situational phobia        Plan:     Doing fine on xanax prn use.  Congratulated him on the new job that sounds like a stable and great opportunity for him.   Continue ways to manage stress such as exercise, golf, family time.    Chancellor was seen today for medical management of chronic issues.  Diagnoses and all orders for this visit:  Generalized anxiety disorder  Situational phobia  Other orders -     ALPRAZolam Prudy Feeler)  0.25 MG tablet; Take 1 tablet (0.25 mg total) by mouth daily.  F/u in next few months for well visit.

## 2023-03-07 ENCOUNTER — Telehealth: Payer: BC Managed Care – PPO | Admitting: Medical

## 2023-07-23 ENCOUNTER — Telehealth: Payer: Self-pay | Admitting: Medical

## 2023-07-23 ENCOUNTER — Other Ambulatory Visit: Payer: Self-pay | Admitting: Medical

## 2023-07-23 NOTE — Telephone Encounter (Signed)
 I refilled some of his medications today.  Please get him in person on the schedule for a physical sometime in the next 2 to 3 months.  His last actual physical and labs was over a year ago in 2023

## 2023-10-04 ENCOUNTER — Telehealth: Payer: Self-pay | Admitting: Internal Medicine

## 2023-10-04 NOTE — Telephone Encounter (Signed)
 Please schedule patient. Omeprazole  was refilled for 6 months for him. He will need an appt before Xanax  can be refilled. You called him in March and he's just now calling back  Copied from CRM [id number]. Topic: Appointments - Scheduling Inquiry for Clinic >> Oct 04, 2023  3:47 PM Lotus Round B wrote: Reason for CRM: pt called in because he was told he has to make a physical appt in order to get his medications refilled . There isnt anything till [facility] and he will need his meds filled soon . If there is anyway there is a sooner appt to please give him a call .

## 2023-10-11 ENCOUNTER — Encounter: Payer: Self-pay | Admitting: Medical

## 2023-10-11 ENCOUNTER — Ambulatory Visit: Payer: Self-pay | Admitting: Medical

## 2023-10-11 VITALS — BP 102/68 | HR 69 | Ht 68.25 in | Wt 147.8 lb

## 2023-10-11 DIAGNOSIS — Z0000 Encounter for general adult medical examination without abnormal findings: Secondary | ICD-10-CM

## 2023-10-11 DIAGNOSIS — Z566 Other physical and mental strain related to work: Secondary | ICD-10-CM

## 2023-10-11 DIAGNOSIS — K219 Gastro-esophageal reflux disease without esophagitis: Secondary | ICD-10-CM

## 2023-10-11 DIAGNOSIS — F411 Generalized anxiety disorder: Secondary | ICD-10-CM

## 2023-10-11 DIAGNOSIS — Z13220 Encounter for screening for lipoid disorders: Secondary | ICD-10-CM | POA: Diagnosis not present

## 2023-10-11 DIAGNOSIS — R5383 Other fatigue: Secondary | ICD-10-CM | POA: Diagnosis not present

## 2023-10-11 DIAGNOSIS — F17200 Nicotine dependence, unspecified, uncomplicated: Secondary | ICD-10-CM

## 2023-10-11 DIAGNOSIS — Z122 Encounter for screening for malignant neoplasm of respiratory organs: Secondary | ICD-10-CM

## 2023-10-11 DIAGNOSIS — E559 Vitamin D deficiency, unspecified: Secondary | ICD-10-CM | POA: Diagnosis not present

## 2023-10-11 LAB — POCT URINALYSIS DIP (PROADVANTAGE DEVICE)
Bilirubin, UA: NEGATIVE
Blood, UA: NEGATIVE
Glucose, UA: NEGATIVE mg/dL
Ketones, POC UA: NEGATIVE mg/dL
Leukocytes, UA: NEGATIVE
Nitrite, UA: NEGATIVE
Protein Ur, POC: NEGATIVE mg/dL
Specific Gravity, Urine: 1.005
Urobilinogen, Ur: 0.2
pH, UA: 7 (ref 5.0–8.0)

## 2023-10-11 MED ORDER — OMEPRAZOLE 40 MG PO CPDR: 40.0000 mg | DELAYED_RELEASE_CAPSULE | Freq: Every day | ORAL | 3 refills | Status: AC

## 2023-10-11 MED ORDER — ALPRAZOLAM 0.25 MG PO TABS: 0.2500 mg | ORAL_TABLET | Freq: Every day | ORAL | 2 refills | Status: DC

## 2023-10-11 NOTE — Progress Notes (Signed)
 Subjective:   HPI  Vincent Walter is a 48 y.o. male who presents for Chief Complaint  Patient presents with   Annual Exam    Wellness visit and fasting labs no other concerns    Patient Care Team: Marvella Jenning, Newt Barefoot as PCP - General (Family Medicine) Sees dentist Sees eye doctor Dr. Samantha Cress, GI   Concerns: Started new job 02/2023.  Working as Glass blower/designer.   Walks probably numerous miles daily on the job.  Smokes off and on, not daily.  Reviewed their medical, surgical, family, social, medication, and allergy history and updated chart as appropriate.  Past Medical History:  Diagnosis Date   Anxiety    Chronic back pain    GERD (gastroesophageal reflux disease)    Headache syndrome    related to anxiety   Normal esophagogastroduodenoscopy (EGD) 12/16/2021   hiatal hernia found, normal stomach normal duodenum   Tobacco abuse    Wears glasses     Past Surgical History:  Procedure Laterality Date   ESOPHAGOGASTRODUODENOSCOPY (EGD) WITH PROPOFOL  N/A 12/16/2021   Procedure: ESOPHAGOGASTRODUODENOSCOPY (EGD) WITH PROPOFOL ;  Surgeon: Urban Garden, MD;  Location: [facility];  Service: Gastroenterology;  Laterality: N/A;  1230  ASA 1   MANDIBLE FRACTURE SURGERY     SHOULDER SURGERY Right     Family History  Problem Relation Age of Onset   Alcohol abuse Mother    Hypertension Father    Cancer Father        skin   Hyperlipidemia Brother    Hypertension Brother    Anxiety disorder Daughter    Cancer Paternal Grandfather    Heart disease Neg Hx    Other Neg Hx    Stroke Neg Hx    Diabetes Neg Hx      Current Outpatient Medications:    ALPRAZolam  (XANAX ) 0.25 MG tablet, TAKE 1 TABLET BY MOUTH EVERY DAY, Disp: 30 tablet, Rfl: 2   omeprazole  (PRILOSEC) 40 MG capsule, TAKE 1 CAPSULE (40 MG TOTAL) BY MOUTH DAILY., Disp: 90 capsule, Rfl: 1  No Known Allergies   Review of Systems  Constitutional:  Negative for chills, fever,  malaise/fatigue and weight loss.  HENT:  Negative for congestion, ear pain, hearing loss, sore throat and tinnitus.   Eyes:  Negative for blurred vision, pain and redness.  Respiratory:  Negative for cough, hemoptysis and shortness of breath.   Cardiovascular:  Negative for chest pain, palpitations, orthopnea, claudication and leg swelling.  Gastrointestinal:  Negative for abdominal pain, blood in stool, constipation, diarrhea, nausea and vomiting.  Genitourinary:  Negative for dysuria, flank pain, frequency, hematuria and urgency.  Musculoskeletal:  Negative for falls, joint pain and myalgias.  Skin:  Negative for itching and rash.  Neurological:  Negative for dizziness, tingling, speech change, weakness and headaches.  Endo/Heme/Allergies:  Negative for polydipsia. Does not bruise/bleed easily.  Psychiatric/Behavioral:  Negative for depression and memory loss. The patient is not nervous/anxious and does not have insomnia.         10/11/2023    8:48 AM 02/27/2023   12:07 PM 02/14/2022    3:18 PM 10/12/2021    2:05 PM 01/14/2021    8:29 AM  Depression screen PHQ 2/9  Decreased Interest 0 0 0 0 0  Down, Depressed, Hopeless 0 0 0 0 0  PHQ - 2 Score 0 0 0 0 0        Objective:  BP 102/68   Pulse 69   Ht 5' 8.25" (  1.734 m)   Wt 147 lb 12.8 oz (67 kg)   SpO2 98%   BMI 22.31 kg/m   Wt Readings from Last 3 Encounters:  10/11/23 147 lb 12.8 oz (67 kg)  02/27/23 145 lb (65.8 kg)  02/14/22 149 lb 6.4 oz (67.8 kg)    General appearance: alert, no distress, WD/WN, Caucasian male Skin: tattoo of panther right upper arm, irregular dark brown 2cm x 1cm lesion left upper anterior arm unchanged per patient for years, scattered macules, no other worrisome lesions HEENT: normocephalic, conjunctiva/corneas normal, sclerae anicteric, PERRLA, EOMi, nares patent, no discharge or erythema, pharynx normal Oral cavity: MMM, tongue normal, teeth normal Neck: supple, no lymphadenopathy, no  thyromegaly, no masses, normal ROM, no bruits Chest: non tender, normal shape and expansion Heart: RRR, normal S1, S2, no murmurs Lungs: CTA bilaterally, no wheezes, rhonchi, or rales Abdomen: +bs, soft, non tender, non distended, no masses, no hepatomegaly, no splenomegaly, no bruits Back: non tender, normal ROM, no scoliosis Musculoskeletal: upper extremities non tender, no obvious deformity, normal ROM throughout, lower extremities non tender, no obvious deformity, normal ROM throughout Extremities: no edema, no cyanosis, no clubbing Pulses: 2+ symmetric, upper and lower extremities, normal cap refill Neurological: alert, oriented x 3, CN2-12 intact, strength normal upper extremities and lower extremities, sensation normal throughout, DTRs 2+ throughout, no cerebellar signs, gait normal Psychiatric: normal affect, behavior normal, pleasant  GU: normal male external genitalia,circumcised, nontender, no masses, no hernia, no lymphadenopathy Rectal: deferred    Assessment and Plan :   Encounter Diagnoses  Name Primary?   Routine general medical examination at a health care facility Yes   Gastroesophageal reflux disease, unspecified whether esophagitis present    Generalized anxiety disorder    Work stress    Smoker    Screening for lipid disorders    Screening for lung cancer     This visit was a preventative care visit, also known as wellness visit or routine physical.   Topics typically include healthy lifestyle, diet, exercise, preventative care, vaccinations, sick and well care, proper use of emergency dept and after hours care, as well as other concerns.     Recommendations: Continue to return yearly for your annual wellness and preventative care visits.  This gives us  a chance to discuss healthy lifestyle, exercise, vaccinations, review your chart record, and perform screenings where appropriate.  I recommend you see your eye doctor yearly for routine vision care.  I  recommend you see your dentist yearly for routine dental care including hygiene visits twice yearly.   Vaccination recommendations were reviewed Immunization History  Administered Date(s) Administered   Influenza Split 02/23/2015   Influenza,inj,Quad PF,6+ Mos 05/15/2019, 02/14/2022   Influenza-Unspecified 02/24/2016, 03/16/2020   Tdap 02/14/2022     Screening for cancer: Colon cancer screening: Cologuard negative 02/2022  Testicular cancer screening You should do a monthly self testicular exam if you are between 43-77 years old  We discussed PSA, prostate exam, and prostate cancer screening risks/benefits.     Skin cancer screening: Check your skin regularly for new changes, growing lesions, or other lesions of concern Come in for evaluation if you have skin lesions of concern.  Lung cancer screening: If you have a greater than 20 pack year history of tobacco use, then you may qualify for lung cancer screening with a chest CT scan.   Please call your insurance company to inquire about coverage for this test.    We currently don't have screenings for other cancers besides  breast, cervical, colon, and lung cancers.  If you have a strong family history of cancer or have other cancer screening concerns, please let me know.    Bone health: Get at least 150 minutes of aerobic exercise weekly Get weight bearing exercise at least once weekly Bone density test:  A bone density test is an imaging test that uses a type of X-ray to measure the amount of calcium and other minerals in your bones. The test may be used to diagnose or screen you for a condition that causes weak or thin bones (osteoporosis), predict your risk for a broken bone (fracture), or determine how well your osteoporosis treatment is working. The bone density test is recommended for females 65 and older, or females or males <65 if certain risk factors such as thyroid disease, long term use of steroids such as for asthma  or rheumatological issues, vitamin D deficiency, estrogen deficiency, family history of osteoporosis, self or family history of fragility fracture in first degree relative.    Heart health: Get at least 150 minutes of aerobic exercise weekly Limit alcohol It is important to maintain a healthy blood pressure and healthy cholesterol numbers  Heart disease screening: Screening for heart disease includes screening for blood pressure, fasting lipids, glucose/diabetes screening, BMI height to weight ratio, reviewed of smoking status, physical activity, and diet.    Goals include blood pressure 120/80 or less, maintaining a healthy lipid/cholesterol profile, preventing diabetes or keeping diabetes numbers under good control, not smoking or using tobacco products, exercising most days per week or at least 150 minutes per week of exercise, and eating healthy variety of fruits and vegetables, healthy oils, and avoiding unhealthy food choices like fried food, fast food, high sugar and high cholesterol foods.    Other tests may possibly include EKG test, CT coronary calcium score, echocardiogram, exercise treadmill stress test.   2017 EKG reviewed   Medical care options: I recommend you continue to seek care here first for routine care.  We try really hard to have available appointments Monday through Friday daytime hours for sick visits, acute visits, and physicals.  Urgent care should be used for after hours and weekends for significant issues that cannot wait till the next day.  The emergency department should be used for significant potentially life-threatening emergencies.  The emergency department is expensive, can often have long wait times for less significant concerns, so try to utilize primary care, urgent care, or telemedicine when possible to avoid unnecessary trips to the emergency department.  Virtual visits and telemedicine have been introduced since the pandemic started in 2020, and can be  convenient ways to receive medical care.  We offer virtual appointments as well to assist you in a variety of options to seek medical care.   Advanced Directives: I recommend you consider completing a Health Care Power of Attorney and Living Will.   These documents respect your wishes and help alleviate burdens on your loved ones if you were to become terminally ill or be in a position to need those documents enforced.    You can complete Advanced Directives yourself, have them notarized, then have copies made for our office, for you and for anybody you feel should have them in safe keeping.  Or, you can have an attorney prepare these documents.   If you haven't updated your Last Will and Testament in a while, it may be worthwhile having an attorney prepare these documents together and save on some costs.  Separate significant issues discussed:  Anxiety-continue Xanax  as needed, reduce stress where possible    Kylor was seen today for annual exam.  Diagnoses and all orders for this visit:  Routine general medical examination at a health care facility -     POCT Urinalysis DIP (Proadvantage Device) -     Comprehensive metabolic panel with GFR -     CBC -     Lipid panel -     TSH -     VITAMIN D 25 Hydroxy (Vit-D Deficiency, Fractures)  Gastroesophageal reflux disease, unspecified whether esophagitis present  Generalized anxiety disorder  Work stress  Smoker -     CT CHEST LUNG CA SCREEN LOW DOSE W/O CM; Future  Screening for lipid disorders -     Lipid panel  Screening for lung cancer -     CT CHEST LUNG CA SCREEN LOW DOSE W/O CM; Future    Follow-up pending labs, yearly for physical

## 2023-10-12 ENCOUNTER — Ambulatory Visit: Payer: Self-pay | Admitting: Medical

## 2023-10-12 ENCOUNTER — Other Ambulatory Visit: Payer: Self-pay | Admitting: Medical

## 2023-10-12 LAB — CBC
Hematocrit: 48.8 % (ref 37.5–51.0)
Hemoglobin: 16.5 g/dL (ref 13.0–17.7)
MCH: 34.1 pg — ABNORMAL HIGH (ref 26.6–33.0)
MCHC: 33.8 g/dL (ref 31.5–35.7)
MCV: 101 fL — ABNORMAL HIGH (ref 79–97)
Platelets: 244 10*3/uL (ref 150–450)
RBC: 4.84 x10E6/uL (ref 4.14–5.80)
RDW: 13.1 % (ref 11.6–15.4)
WBC: 5.1 10*3/uL (ref 3.4–10.8)

## 2023-10-12 LAB — COMPREHENSIVE METABOLIC PANEL WITH GFR
ALT: 22 IU/L (ref 0–44)
AST: 18 IU/L (ref 0–40)
Albumin: 4.5 g/dL (ref 4.1–5.1)
Alkaline Phosphatase: 80 IU/L (ref 44–121)
BUN/Creatinine Ratio: 14 (ref 9–20)
BUN: 12 mg/dL (ref 6–24)
Bilirubin Total: 0.9 mg/dL (ref 0.0–1.2)
CO2: 22 mmol/L (ref 20–29)
Calcium: 9.8 mg/dL (ref 8.7–10.2)
Chloride: 104 mmol/L (ref 96–106)
Creatinine, Ser: 0.86 mg/dL (ref 0.76–1.27)
Globulin, Total: 2.5 g/dL (ref 1.5–4.5)
Glucose: 80 mg/dL (ref 70–99)
Potassium: 4.7 mmol/L (ref 3.5–5.2)
Sodium: 139 mmol/L (ref 134–144)
Total Protein: 7 g/dL (ref 6.0–8.5)
eGFR: 107 mL/min/{1.73_m2} (ref >59)

## 2023-10-12 LAB — LIPID PANEL
Chol/HDL Ratio: 4.3 ratio (ref 0.0–5.0)
Cholesterol, Total: 216 mg/dL — ABNORMAL HIGH (ref 100–199)
HDL: 50 mg/dL (ref >39)
LDL Chol Calc (NIH): 143 mg/dL — ABNORMAL HIGH (ref 0–99)
Triglycerides: 131 mg/dL (ref 0–149)
VLDL Cholesterol Cal: 23 mg/dL (ref 5–40)

## 2023-10-12 LAB — VITAMIN D 25 HYDROXY (VIT D DEFICIENCY, FRACTURES): Vit D, 25-Hydroxy: 20.5 ng/mL — ABNORMAL LOW (ref 30.0–100.0)

## 2023-10-12 LAB — TSH: TSH: 1.25 u[IU]/mL (ref 0.450–4.500)

## 2023-10-12 MED ORDER — VITAMIN D (ERGOCALCIFEROL) 1.25 MG (50000 UNIT) PO CAPS: 50000.0000 [IU] | ORAL_CAPSULE | ORAL | 3 refills | Status: AC

## 2023-10-12 NOTE — Progress Notes (Signed)
 Results sent through MyChart

## 2023-10-23 ENCOUNTER — Other Ambulatory Visit: Payer: Self-pay | Admitting: Medical

## 2024-01-23 ENCOUNTER — Other Ambulatory Visit: Payer: Self-pay | Admitting: Medical

## 2024-04-23 ENCOUNTER — Other Ambulatory Visit: Payer: Self-pay | Admitting: Medical

## 2024-06-25 ENCOUNTER — Other Ambulatory Visit: Payer: Self-pay | Admitting: Medical

## 2024-10-10 ENCOUNTER — Encounter: Payer: Self-pay | Admitting: Medical
